# Patient Record
Sex: Female | Born: 1975 | Race: Black or African American | Hispanic: No | Marital: Married | Smoking: Never smoker
Health system: Southern US, Community
[De-identification: ages and names within clinical notes are randomized; demographics above are authoritative.]

## PROBLEM LIST (undated history)

## (undated) DIAGNOSIS — R87629 Unspecified abnormal cytological findings in specimens from vagina: Secondary | ICD-10-CM

## (undated) HISTORY — PX: NO PAST SURGERIES: SHX2092

---

## 2015-11-17 DIAGNOSIS — R87629 Unspecified abnormal cytological findings in specimens from vagina: Secondary | ICD-10-CM

## 2015-11-17 HISTORY — DX: Unspecified abnormal cytological findings in specimens from vagina: R87.629

## 2015-11-17 NOTE — L&D Delivery Note (Signed)
Delivery Note At 3:51 PM a viable female was delivered via  (Presentation: OA;  ).  APGAR:8,9 ; weight penidng .   Placenta status: intact, .  Cord: 3 vessel  with the following complications:none .  Cord pH: not done  Anesthesia:  none Episiotomy:  none Lacerations:  none Suture Repair: none Est. Blood Loss (mL):    Mom to postpartum.  Baby to Couplet care / Skin to Skin   Routine postpartum care.  Iverson Sees H 10/09/2016, 4:03 PM

## 2016-02-07 ENCOUNTER — Ambulatory Visit (INDEPENDENT_AMBULATORY_CARE_PROVIDER_SITE_OTHER): Payer: Self-pay | Admitting: Physician Assistant

## 2016-02-07 VITALS — BP 112/74 | HR 99 | Temp 99.3°F | Resp 17 | Ht 69.5 in | Wt 230.0 lb

## 2016-02-07 DIAGNOSIS — Z3490 Encounter for supervision of normal pregnancy, unspecified, unspecified trimester: Secondary | ICD-10-CM

## 2016-02-07 DIAGNOSIS — R110 Nausea: Secondary | ICD-10-CM

## 2016-02-07 DIAGNOSIS — R1030 Lower abdominal pain, unspecified: Secondary | ICD-10-CM

## 2016-02-07 DIAGNOSIS — N912 Amenorrhea, unspecified: Secondary | ICD-10-CM

## 2016-02-07 DIAGNOSIS — R6889 Other general symptoms and signs: Secondary | ICD-10-CM

## 2016-02-07 LAB — POCT URINALYSIS DIP (MANUAL ENTRY)
Bilirubin, UA: NEGATIVE
Glucose, UA: NEGATIVE
Ketones, POC UA: NEGATIVE
Leukocytes, UA: NEGATIVE
Nitrite, UA: NEGATIVE
Protein Ur, POC: NEGATIVE
Spec Grav, UA: 1.01
Urobilinogen, UA: 0.2
pH, UA: 6

## 2016-02-07 LAB — POCT URINE PREGNANCY: Preg Test, Ur: POSITIVE — AB

## 2016-02-07 MED ORDER — PRENATAL VITAMINS 0.8 MG PO TABS: 1.0000 | ORAL_TABLET | Freq: Every day | ORAL | Status: DC

## 2016-02-07 MED ORDER — PRENATAL VITAMINS 0.8 MG PO TABS: 1.0000 | ORAL_TABLET | Freq: Every day | ORAL | Status: AC

## 2016-02-07 NOTE — Patient Instructions (Addendum)
4 weeks - 5 days pregnant Due 10/10/2016  Pregnancy care help for the uninsured - will help her get medicaid to help with the cost  [phone] [facility] LipstickBlog.huWww.ncdhhs.gov/medicaid  [place] [facility]  [place] - [phone] [facility] [phone] [facility] - [phone] [facility] - [phone]    If you start bleeding or have a lot of cramping [facility] to [facility] to have further testing

## 2016-02-07 NOTE — Progress Notes (Signed)
Subjective:     Patient ID: Cindy [name], female   DOB: 12/31/1975, 40 y.o.   MRN: [id number]  HPI The history was taken by the patient's english speaking boyfriend.  The patient has been experiencing head, stomach and general body aches. He has shakes and chills. Symptoms started 3 weeks ago, and have been on and off ever since. Patient has nausea with no vomiting. States her body feels warm but no objective temperature has been taken. She denies stomach pain, no diarrhea, constipation or additional bowel changes.  Endorses lower back pain, patient thinks she is pregnant. Currently sexually active with boyfriend, does not use condoms or any protection during sex. She has had 5 pregnancies previously, children live in [place].  Review of Systems All pertinent ROS as above in HPI    Objective:   Physical Exam  Constitutional: She appears well-developed and well-nourished.  HENT:  Head: Normocephalic and atraumatic.  Right Ear: External ear normal.  Left Ear: External ear normal.  Nose: Nose normal.  Mouth/Throat: Oropharynx is clear and moist. No oropharyngeal exudate.  Eyes: EOM are normal. Pupils are equal, round, and reactive to light.  Neck: Neck supple.  Cardiovascular: Normal rate, regular rhythm, normal heart sounds and intact distal pulses.  Exam reveals no gallop and no friction rub.   No murmur heard. Pulmonary/Chest: Breath sounds normal. No respiratory distress. She has no wheezes. She has no rales.  Abdominal: Soft. Bowel sounds are normal. She exhibits no distension. There is no rebound and no guarding.  Mild suprapubic tenderness to palpation  Lymphadenopathy:    She has no cervical adenopathy.  Skin: Skin is warm and dry.    Results for orders placed or performed in visit on 02/07/16  POCT urine pregnancy  Result Value Ref Range   Preg Test, Ur Positive (A) Negative  POCT urinalysis dipstick  Result Value Ref Range   Color, UA yellow yellow   Clarity, UA clear  clear   Glucose, UA negative negative   Bilirubin, UA negative negative   Ketones, POC UA negative negative   Spec Grav, UA 1.010    Blood, UA trace-intact (A) negative   pH, UA 6.0    Protein Ur, POC negative negative   Urobilinogen, UA 0.2    Nitrite, UA Negative Negative   Leukocytes, UA Negative Negative      Assessment:     The patient is a 40 year old african female with a positive pregnancy test. Patient is 4wk 5d pregnant, no bleeding or urinary symptoms at this time. Will give patient prenatal vitamins and the information to establish medicaid insurance for prenatal care.     Plan:      2. Nausea without vomiting - POCT urine pregnancy  3. Lower abdominal pain - POCT urinalysis dipstick  4. Amenorrhea - Prenatal Multivit-Min-Fe-FA (PRENATAL VITAMINS) 0.8 MG tablet; Take 1 tablet by mouth daily.  Dispense: 90 tablet; Refill: 3

## 2016-02-07 NOTE — Progress Notes (Signed)
   Cindy [name]  MRN: [id number] DOB: 10/13/1976  Subjective:  Pt presents to clinic with head, stomach and general body aches. Has shakes and chills. Symptoms started 3 weeks ago, and have been on and off ever since. Patient has nausea with no vomiting. States her body feels warm but no objective temperature has been taken. She denies stomach pain, no diarrhea, constipation or additional bowel changes.  Endorses lower back pain, patient thinks she is pregnant. Currently sexually active with boyfriend, does not use condoms or any protection during sex. She has had 5 pregnancies previously, children live in [place].  Boyfriend interprets the visit as we were unable to get an interpretor on the phone that could speak her language.     There are no active problems to display for this patient.   No current outpatient prescriptions on file prior to visit.   No current facility-administered medications on file prior to visit.    No Known Allergies  Review of Systems  Constitutional: Negative for fever and chills.  Gastrointestinal: Positive for nausea.   Objective:  BP 112/74 mmHg  Pulse 99  Temp(Src) 99.3 F (37.4 C) (Oral)  Resp 17  Ht 5' 9.5" (1.765 m)  Wt 230 lb (104.327 kg)  BMI 33.49 kg/m2  SpO2 99%  LMP 01/04/2016  Physical Exam  Constitutional: She is oriented to person, place, and time and well-developed, well-nourished, and in no distress.  HENT:  Head: Normocephalic and atraumatic.  Right Ear: Hearing and external ear normal.  Left Ear: Hearing and external ear normal.  Eyes: Conjunctivae are normal.  Neck: Normal range of motion.  Cardiovascular: Normal rate, regular rhythm and normal heart sounds.   No murmur heard. Pulmonary/Chest: Effort normal and breath sounds normal. She has no wheezes.  Neurological: She is alert and oriented to person, place, and time. Gait normal.  Skin: Skin is warm and dry.  Psychiatric: Mood, memory, affect and judgment normal.    Vitals reviewed.   Results for orders placed or performed in visit on 02/07/16  POCT urine pregnancy  Result Value Ref Range   Preg Test, Ur Positive (A) Negative  POCT urinalysis dipstick  Result Value Ref Range   Color, UA yellow yellow   Clarity, UA clear clear   Glucose, UA negative negative   Bilirubin, UA negative negative   Ketones, POC UA negative negative   Spec Grav, UA 1.010    Blood, UA trace-intact (A) negative   pH, UA 6.0    Protein Ur, POC negative negative   Urobilinogen, UA 0.2    Nitrite, UA Negative Negative   Leukocytes, UA Negative Negative    Assessment and Plan :  Flu-like symptoms  Nausea without vomiting - Plan: POCT urine pregnancy  Lower abdominal pain - Plan: POCT urinalysis dipstick  Amenorrhea - Plan: Prenatal Multivit-Min-Fe-FA (PRENATAL VITAMINS) 0.8 MG tablet,   Pregnancy - PNV and gave her information for non-insured pregnant patients  Benny LennertSarah Weber PA-C  [facility] 02/07/2016 3:04 PM

## 2016-04-09 ENCOUNTER — Other Ambulatory Visit (HOSPITAL_COMMUNITY): Payer: Self-pay | Admitting: Nurse Practitioner

## 2016-04-09 DIAGNOSIS — Z3689 Encounter for other specified antenatal screening: Secondary | ICD-10-CM

## 2016-04-09 LAB — OB RESULTS CONSOLE GBS: STREP GROUP B AG: POSITIVE

## 2016-04-09 LAB — OB RESULTS CONSOLE GC/CHLAMYDIA
CHLAMYDIA, DNA PROBE: NEGATIVE
Gonorrhea: NEGATIVE

## 2016-04-09 LAB — OB RESULTS CONSOLE HIV ANTIBODY (ROUTINE TESTING)
HIV: NONREACTIVE
HIV: NONREACTIVE

## 2016-04-09 LAB — OB RESULTS CONSOLE RUBELLA ANTIBODY, IGM: Rubella: IMMUNE

## 2016-04-09 LAB — OB RESULTS CONSOLE RPR: RPR: NONREACTIVE

## 2016-04-09 LAB — OB RESULTS CONSOLE VARICELLA ZOSTER ANTIBODY, IGG: VARICELLA IGG: IMMUNE

## 2016-04-09 LAB — OB RESULTS CONSOLE HEPATITIS B SURFACE ANTIGEN: Hepatitis B Surface Ag: POSITIVE

## 2016-04-28 ENCOUNTER — Encounter (HOSPITAL_COMMUNITY): Payer: Self-pay | Admitting: Nurse Practitioner

## 2016-05-05 ENCOUNTER — Encounter (HOSPITAL_COMMUNITY): Payer: Self-pay

## 2016-05-06 ENCOUNTER — Encounter (HOSPITAL_COMMUNITY): Payer: Self-pay

## 2016-05-06 ENCOUNTER — Ambulatory Visit (HOSPITAL_COMMUNITY)
Admission: RE | Admit: 2016-05-06 | Discharge: 2016-05-06 | Disposition: A | Discharge status: Home / Self Care | Payer: Medicaid Other | Source: Ambulatory Visit | Attending: Nurse Practitioner | Admitting: Nurse Practitioner

## 2016-05-06 ENCOUNTER — Other Ambulatory Visit (HOSPITAL_COMMUNITY): Payer: Self-pay | Admitting: Maternal and Fetal Medicine

## 2016-05-06 DIAGNOSIS — O09522 Supervision of elderly multigravida, second trimester: Secondary | ICD-10-CM

## 2016-05-06 DIAGNOSIS — Z0489 Encounter for examination and observation for other specified reasons: Secondary | ICD-10-CM

## 2016-05-06 DIAGNOSIS — Z3A23 23 weeks gestation of pregnancy: Secondary | ICD-10-CM

## 2016-05-06 DIAGNOSIS — O09529 Supervision of elderly multigravida, unspecified trimester: Secondary | ICD-10-CM | POA: Insufficient documentation

## 2016-05-06 DIAGNOSIS — Z36 Encounter for antenatal screening of mother: Secondary | ICD-10-CM | POA: Insufficient documentation

## 2016-05-06 DIAGNOSIS — IMO0002 Reserved for concepts with insufficient information to code with codable children: Secondary | ICD-10-CM

## 2016-05-06 DIAGNOSIS — Z3A19 19 weeks gestation of pregnancy: Secondary | ICD-10-CM

## 2016-05-06 DIAGNOSIS — Z3689 Encounter for other specified antenatal screening: Secondary | ICD-10-CM

## 2016-05-06 NOTE — Progress Notes (Signed)
Appointment Date: 05/06/2016 DOB: 03/10/1976 Referring Provider: Trina AoBaker, Sandra K, NP Attending: Dr. Alpha GulaPaul Whitecar  Ms. Eyvonne LeftSalma Mccormick and her boyfriend, Fulton MoleHassane Moussa, were seen for genetic counseling because of a maternal age of 40 y.o..   Patient declined a medical interpreter, preferring to have her boyfriend translate.    In summary:  Discussed maternal age and associated risks for fetal aneuploidy  Reviewed normal results of Quad screen  Reduction in risk for Down syndrome and Trisomy 18  Not screening for other fetal aneuploidies  Reviewed screening available by ultrasound  Offered additional testing and screening  Declined NIPS  Declined amniocentesis  Reviewed family history concerns - none reported  Discussed carrier screening options - declined  CF  SMA  They were counseled regarding maternal age and the association with risk for chromosome conditions due to nondisjunction with aging of the ova.   We reviewed chromosomes, nondisjunction, and the associated 1 in 7770 risk for fetal aneuploidy related to a maternal age of 40 y.o. at 3948w0d gestation.  They were counseled that the risk for aneuploidy decreases as gestational age increases, accounting for those pregnancies which spontaneously abort.  We specifically discussed Down syndrome (trisomy 4421), trisomies 4013 and 6918, and sex chromosome aneuploidies (47,XXX and 47,XXY) including the common features and prognoses of each.   We discussed that the Quad screen is able to adjust a person's baseline (age related) chance for specific chromosome conditions.  Based on this test, her chance for Down syndrome was reduced from her age related chance of 1 in 95175 to 1 in 2552 ; the Quad screen also reduced the chance for Trisomy 2918 from her age related chance to 1 in 1,175.   She understands that Quad screen can not adjust her age related chance for sex chromosome aneuploidies or Trisomy 5613.  She was counseled that 50-80% of fetuses  with Down syndrome and up to 90% of fetuses with trisomies 13 and 18, when well visualized, have detectable anomalies or soft markers by ultrasound.  She was scheduled for ultrasound after her genetic counseling visit.     We also discussed the option of noninvasive prenatal screening (NIPS)/cell free DNA (cfDNA) screening.  They were counseled that this screening test can provide a pregnancy specific risk assessment. We reviewed the benefits and limitations of this screening option. Specifically, we discussed the conditions for which the test screens, the detection rates, and false positive rates for each. She was also counseled regarding diagnostic testing via amniocentesis. We reviewed the approximate 1 in 300-500 risk for complications for amniocentesis, including spontaneous pregnancy loss.   A complete ultrasound was performed today following our visit. The ultrasound report will be sent under separate cover.  Additional screening and diagnostic testing were declined today.  She understands that screening tests, including ultrasound and Quad screen, cannot rule out all birth defects or genetic syndromes. The patient was advised of this limitation and states she still does not want additional testing or screening at this time.   Mrs. Myna Hidalgodrissa was provided with written information regarding cystic fibrosis (CF), spinal muscular atrophy (SMA) and hemoglobinopathies including the carrier frequency, availability of carrier screening and prenatal diagnosis if indicated.  In addition, we discussed that CF and hemoglobinopathies are routinely screened for as part of the [facility] newborn screening panel.  After further discussion, she declined screening for CF and SMA .  A review of her medical record revealed that screening for hemoglobinopathies has been performed, but the results were not available.  Both family histories were reviewed and found to be noncontributory for birth defects, intellectual disability, and  known genetic conditions. Without further information regarding the provided family history, an accurate genetic risk cannot be calculated. Further genetic counseling is warranted if more information is obtained.  Mrs. Compere denied exposure to environmental toxins or chemical agents. She denied the use of alcohol, tobacco or street drugs. She denied significant viral illnesses during the course of her pregnancy. Her medical and surgical histories were noncontributory.   I counseled this couple regarding the above risks and available options.  The approximate face-to-face time with the genetic counselor was 45 minutes.  Mady Gemma, MS,  Certified Genetic Counselor

## 2016-05-15 ENCOUNTER — Encounter (HOSPITAL_COMMUNITY): Payer: Self-pay

## 2016-05-15 ENCOUNTER — Ambulatory Visit (HOSPITAL_COMMUNITY): Payer: Self-pay

## 2016-05-20 ENCOUNTER — Other Ambulatory Visit (HOSPITAL_COMMUNITY): Payer: Self-pay

## 2016-06-03 ENCOUNTER — Ambulatory Visit (HOSPITAL_COMMUNITY)
Admission: RE | Admit: 2016-06-03 | Discharge: 2016-06-03 | Disposition: A | Discharge status: Home / Self Care | Payer: Medicaid Other | Source: Ambulatory Visit | Attending: Nurse Practitioner | Admitting: Nurse Practitioner

## 2016-06-03 ENCOUNTER — Encounter (HOSPITAL_COMMUNITY): Payer: Self-pay

## 2016-06-03 DIAGNOSIS — O09522 Supervision of elderly multigravida, second trimester: Secondary | ICD-10-CM | POA: Insufficient documentation

## 2016-06-03 DIAGNOSIS — IMO0002 Reserved for concepts with insufficient information to code with codable children: Secondary | ICD-10-CM

## 2016-06-03 DIAGNOSIS — Z3A23 23 weeks gestation of pregnancy: Secondary | ICD-10-CM | POA: Insufficient documentation

## 2016-06-03 DIAGNOSIS — Z36 Encounter for antenatal screening of mother: Secondary | ICD-10-CM | POA: Insufficient documentation

## 2016-06-03 DIAGNOSIS — Z0489 Encounter for examination and observation for other specified reasons: Secondary | ICD-10-CM

## 2016-06-04 ENCOUNTER — Other Ambulatory Visit (HOSPITAL_COMMUNITY): Payer: Self-pay | Admitting: *Deleted

## 2016-06-04 DIAGNOSIS — O09529 Supervision of elderly multigravida, unspecified trimester: Secondary | ICD-10-CM

## 2016-06-05 ENCOUNTER — Encounter: Payer: Self-pay | Admitting: Obstetrics & Gynecology

## 2016-06-05 ENCOUNTER — Ambulatory Visit (INDEPENDENT_AMBULATORY_CARE_PROVIDER_SITE_OTHER): Payer: Medicaid Other | Admitting: Obstetrics & Gynecology

## 2016-06-05 VITALS — BP 107/65 | HR 74 | Wt 220.9 lb

## 2016-06-05 DIAGNOSIS — R87612 Low grade squamous intraepithelial lesion on cytologic smear of cervix (LGSIL): Secondary | ICD-10-CM | POA: Insufficient documentation

## 2016-06-05 LAB — POCT URINALYSIS DIP (DEVICE)
BILIRUBIN URINE: NEGATIVE
GLUCOSE, UA: NEGATIVE mg/dL
KETONES UR: NEGATIVE mg/dL
Leukocytes, UA: NEGATIVE
Nitrite: NEGATIVE
Protein, ur: NEGATIVE mg/dL
SPECIFIC GRAVITY, URINE: 1.02 (ref 1.005–1.030)
Urobilinogen, UA: 0.2 mg/dL (ref 0.0–1.0)
pH: 6 (ref 5.0–8.0)

## 2016-06-05 NOTE — Patient Instructions (Signed)

## 2016-06-05 NOTE — Progress Notes (Signed)
Patient ID: Cindy [name], female   DOB: 07/08/1976, 40 y.o.   MRN: [id number]  Chief Complaint  Patient presents with  . Colposcopy  [facility], [redacted] week EGA  HPI Cindy [name] is a 40 y.o. female.  [id number] 7849w2d   HPI  Indications: Pap smear on May 2017 showed: low-grade squamous intraepithelial neoplasia (LGSIL - encompassing HPV,mild dysplasia,CIN I). Previous colposcopy: no and in . Prior cervical treatment: no treatment.  No past medical history on file.  No past surgical history on file.  No family history on file.  Social History Social History  Substance Use Topics  . Smoking status: Never Smoker   . Smokeless tobacco: None  . Alcohol Use: None    No Known Allergies  Current Outpatient Prescriptions  Medication Sig Dispense Refill  . Prenatal Multivit-Min-Fe-FA (PRENATAL VITAMINS) 0.8 MG tablet Take 1 tablet by mouth daily. 90 tablet 3   No current facility-administered medications for this visit.    Review of Systems Review of Systems  Genitourinary: Negative for vaginal bleeding and vaginal discharge.    Blood pressure 107/65, pulse 74, weight 220 lb 14.4 oz (100.2 kg), last menstrual period 01/08/2016.  Physical Exam Physical Exam  Constitutional: She appears well-developed. No distress.  Pulmonary/Chest: Effort normal.  Psychiatric: She has a normal mood and affect. Her behavior is normal.    Data Reviewed Pap result and US  Assessment LSIL pap at [redacted] weeks gestation        Plan    Defer colposcopy and Bx until 6 week PP according to acceptable guideline Continue PNC at HD       Arvie Villarruel 06/05/2016, 9:54 AM

## 2016-07-10 ENCOUNTER — Encounter: Payer: Self-pay | Admitting: *Deleted

## 2016-07-17 ENCOUNTER — Other Ambulatory Visit (HOSPITAL_COMMUNITY): Payer: Self-pay | Admitting: Obstetrics and Gynecology

## 2016-07-17 ENCOUNTER — Ambulatory Visit (HOSPITAL_COMMUNITY)
Admission: RE | Admit: 2016-07-17 | Discharge: 2016-07-17 | Disposition: A | Discharge status: Home / Self Care | Payer: Medicaid Other | Source: Ambulatory Visit | Attending: Nurse Practitioner | Admitting: Nurse Practitioner

## 2016-07-17 ENCOUNTER — Encounter (HOSPITAL_COMMUNITY): Payer: Self-pay

## 2016-07-17 DIAGNOSIS — Z3A29 29 weeks gestation of pregnancy: Secondary | ICD-10-CM

## 2016-07-17 DIAGNOSIS — O09523 Supervision of elderly multigravida, third trimester: Secondary | ICD-10-CM

## 2016-07-17 DIAGNOSIS — Z36 Encounter for antenatal screening of mother: Secondary | ICD-10-CM | POA: Diagnosis not present

## 2016-07-17 DIAGNOSIS — O09529 Supervision of elderly multigravida, unspecified trimester: Secondary | ICD-10-CM

## 2016-07-21 ENCOUNTER — Other Ambulatory Visit (HOSPITAL_COMMUNITY): Payer: Self-pay | Admitting: *Deleted

## 2016-07-21 DIAGNOSIS — O09529 Supervision of elderly multigravida, unspecified trimester: Secondary | ICD-10-CM

## 2016-09-07 ENCOUNTER — Other Ambulatory Visit (HOSPITAL_COMMUNITY): Payer: Self-pay | Admitting: Obstetrics and Gynecology

## 2016-09-07 ENCOUNTER — Ambulatory Visit (HOSPITAL_COMMUNITY)
Admission: RE | Admit: 2016-09-07 | Discharge: 2016-09-07 | Disposition: A | Discharge status: Home / Self Care | Payer: Medicaid Other | Source: Ambulatory Visit | Attending: Nurse Practitioner | Admitting: Nurse Practitioner

## 2016-09-07 ENCOUNTER — Encounter (HOSPITAL_COMMUNITY): Payer: Self-pay

## 2016-09-07 DIAGNOSIS — Z3689 Encounter for other specified antenatal screening: Secondary | ICD-10-CM

## 2016-09-07 DIAGNOSIS — O99213 Obesity complicating pregnancy, third trimester: Secondary | ICD-10-CM | POA: Diagnosis present

## 2016-09-07 DIAGNOSIS — Z3A36 36 weeks gestation of pregnancy: Secondary | ICD-10-CM | POA: Insufficient documentation

## 2016-09-07 DIAGNOSIS — O09523 Supervision of elderly multigravida, third trimester: Secondary | ICD-10-CM | POA: Insufficient documentation

## 2016-09-07 DIAGNOSIS — O09529 Supervision of elderly multigravida, unspecified trimester: Secondary | ICD-10-CM

## 2016-09-07 NOTE — Addendum Note (Signed)
Encounter addended by: Jarmarcus Wambold M Rhema Boyett, RT on: 09/07/2016 12:26 PM<BR>    Actions taken: Imaging Exam ended

## 2016-10-01 ENCOUNTER — Telehealth (HOSPITAL_COMMUNITY): Payer: Self-pay | Admitting: *Deleted

## 2016-10-01 NOTE — Telephone Encounter (Signed)
Preadmission screen  

## 2016-10-09 ENCOUNTER — Encounter (HOSPITAL_COMMUNITY): Payer: Self-pay

## 2016-10-09 ENCOUNTER — Inpatient Hospital Stay (HOSPITAL_COMMUNITY)
Admission: RE | Admit: 2016-10-09 | Discharge: 2016-10-11 | DRG: 775 | Disposition: A | Discharge status: Home / Self Care | Payer: Medicaid Other | Source: Ambulatory Visit | Attending: Obstetrics & Gynecology | Admitting: Obstetrics & Gynecology

## 2016-10-09 DIAGNOSIS — Z3A41 41 weeks gestation of pregnancy: Secondary | ICD-10-CM

## 2016-10-09 DIAGNOSIS — O480 Post-term pregnancy: Principal | ICD-10-CM | POA: Diagnosis present

## 2016-10-09 LAB — RPR: RPR Ser Ql: NONREACTIVE

## 2016-10-09 LAB — CBC
HEMATOCRIT: 37.8 % (ref 36.0–46.0)
HEMOGLOBIN: 12.9 g/dL (ref 12.0–15.0)
MCH: 28.5 pg (ref 26.0–34.0)
MCHC: 34.1 g/dL (ref 30.0–36.0)
MCV: 83.6 fL (ref 78.0–100.0)
Platelets: 208 10*3/uL (ref 150–400)
RBC: 4.52 MIL/uL (ref 3.87–5.11)
RDW: 13.6 % (ref 11.5–15.5)
WBC: 7.7 10*3/uL (ref 4.0–10.5)

## 2016-10-09 LAB — ABO/RH: ABO/RH(D): O POS

## 2016-10-09 LAB — TYPE AND SCREEN
ABO/RH(D): O POS
Antibody Screen: NEGATIVE

## 2016-10-09 MED ORDER — OXYTOCIN BOLUS FROM INFUSION
500.0000 mL | Freq: Once | INTRAVENOUS | Status: AC
  Administered 2016-10-09: 500 mL via INTRAVENOUS

## 2016-10-09 MED ORDER — BENZOCAINE-MENTHOL 20-0.5 % EX AERO: 1.0000 "application " | INHALATION_SPRAY | CUTANEOUS | Status: DC | PRN

## 2016-10-09 MED ORDER — OXYTOCIN 40 UNITS IN LACTATED RINGERS INFUSION - SIMPLE MED
2.5000 [IU]/h | INTRAVENOUS | Status: DC
  Filled 2016-10-09: qty 1000

## 2016-10-09 MED ORDER — ZOLPIDEM TARTRATE 5 MG PO TABS: 5.0000 mg | ORAL_TABLET | Freq: Every evening | ORAL | Status: DC | PRN

## 2016-10-09 MED ORDER — LIDOCAINE HCL (PF) 1 % IJ SOLN
30.0000 mL | INTRAMUSCULAR | Status: DC | PRN
  Filled 2016-10-09: qty 30

## 2016-10-09 MED ORDER — SIMETHICONE 80 MG PO CHEW: 80.0000 mg | CHEWABLE_TABLET | ORAL | Status: DC | PRN

## 2016-10-09 MED ORDER — TERBUTALINE SULFATE 1 MG/ML IJ SOLN
0.2500 mg | Freq: Once | INTRAMUSCULAR | Status: DC | PRN
  Filled 2016-10-09: qty 1

## 2016-10-09 MED ORDER — ONDANSETRON HCL 4 MG/2ML IJ SOLN: 4.0000 mg | INTRAMUSCULAR | Status: DC | PRN

## 2016-10-09 MED ORDER — DIBUCAINE 1 % RE OINT: 1.0000 "application " | TOPICAL_OINTMENT | RECTAL | Status: DC | PRN

## 2016-10-09 MED ORDER — MISOPROSTOL 25 MCG QUARTER TABLET
25.0000 ug | ORAL_TABLET | ORAL | Status: DC | PRN
  Administered 2016-10-09: 25 ug via VAGINAL
  Filled 2016-10-09: qty 0.25
  Filled 2016-10-09: qty 1

## 2016-10-09 MED ORDER — COCONUT OIL OIL: 1.0000 "application " | TOPICAL_OIL | Status: DC | PRN

## 2016-10-09 MED ORDER — ACETAMINOPHEN 325 MG PO TABS: 650.0000 mg | ORAL_TABLET | ORAL | Status: DC | PRN

## 2016-10-09 MED ORDER — IBUPROFEN 600 MG PO TABS
600.0000 mg | ORAL_TABLET | Freq: Four times a day (QID) | ORAL | Status: DC
  Administered 2016-10-09 – 2016-10-11 (×8): 600 mg via ORAL
  Filled 2016-10-09 (×8): qty 1

## 2016-10-09 MED ORDER — ACETAMINOPHEN 325 MG PO TABS
650.0000 mg | ORAL_TABLET | ORAL | Status: DC | PRN
Start: 2016-10-09 — End: 2016-10-11
  Administered 2016-10-09 – 2016-10-11 (×3): 650 mg via ORAL
  Filled 2016-10-09 (×3): qty 2

## 2016-10-09 MED ORDER — FENTANYL CITRATE (PF) 100 MCG/2ML IJ SOLN: 100.0000 ug | INTRAMUSCULAR | Status: DC | PRN

## 2016-10-09 MED ORDER — LACTATED RINGERS IV SOLN: 500.0000 mL | INTRAVENOUS | Status: DC | PRN

## 2016-10-09 MED ORDER — TETANUS-DIPHTH-ACELL PERTUSSIS 5-2.5-18.5 LF-MCG/0.5 IM SUSP: 0.5000 mL | Freq: Once | INTRAMUSCULAR | Status: DC

## 2016-10-09 MED ORDER — PENICILLIN G POTASSIUM 5000000 UNITS IJ SOLR
5.0000 10*6.[IU] | Freq: Once | INTRAVENOUS | Status: AC
  Administered 2016-10-09: 5 10*6.[IU] via INTRAVENOUS
  Filled 2016-10-09: qty 5

## 2016-10-09 MED ORDER — METHYLERGONOVINE MALEATE 0.2 MG PO TABS: 0.2000 mg | ORAL_TABLET | ORAL | Status: DC | PRN

## 2016-10-09 MED ORDER — METHYLERGONOVINE MALEATE 0.2 MG/ML IJ SOLN: 0.2000 mg | INTRAMUSCULAR | Status: DC | PRN

## 2016-10-09 MED ORDER — OXYCODONE-ACETAMINOPHEN 5-325 MG PO TABS: 1.0000 | ORAL_TABLET | ORAL | Status: DC | PRN

## 2016-10-09 MED ORDER — DIPHENHYDRAMINE HCL 25 MG PO CAPS: 25.0000 mg | ORAL_CAPSULE | Freq: Four times a day (QID) | ORAL | Status: DC | PRN

## 2016-10-09 MED ORDER — ONDANSETRON HCL 4 MG/2ML IJ SOLN: 4.0000 mg | Freq: Four times a day (QID) | INTRAMUSCULAR | Status: DC | PRN

## 2016-10-09 MED ORDER — ONDANSETRON HCL 4 MG PO TABS: 4.0000 mg | ORAL_TABLET | ORAL | Status: DC | PRN

## 2016-10-09 MED ORDER — LACTATED RINGERS IV SOLN
INTRAVENOUS | Status: DC
  Administered 2016-10-09: 08:00:00 via INTRAVENOUS

## 2016-10-09 MED ORDER — PRENATAL MULTIVITAMIN CH
1.0000 | ORAL_TABLET | Freq: Every day | ORAL | Status: DC
  Administered 2016-10-10 – 2016-10-11 (×2): 1 via ORAL
  Filled 2016-10-09 (×2): qty 1

## 2016-10-09 MED ORDER — PENICILLIN G POT IN DEXTROSE 60000 UNIT/ML IV SOLN
3.0000 10*6.[IU] | INTRAVENOUS | Status: DC
  Administered 2016-10-09: 3 10*6.[IU] via INTRAVENOUS
  Filled 2016-10-09 (×4): qty 50

## 2016-10-09 MED ORDER — WITCH HAZEL-GLYCERIN EX PADS: 1.0000 "application " | MEDICATED_PAD | CUTANEOUS | Status: DC | PRN

## 2016-10-09 MED ORDER — SOD CITRATE-CITRIC ACID 500-334 MG/5ML PO SOLN: 30.0000 mL | ORAL | Status: DC | PRN

## 2016-10-09 MED ORDER — FLEET ENEMA 7-19 GM/118ML RE ENEM: 1.0000 | ENEMA | RECTAL | Status: DC | PRN

## 2016-10-09 MED ORDER — SENNOSIDES-DOCUSATE SODIUM 8.6-50 MG PO TABS
2.0000 | ORAL_TABLET | ORAL | Status: DC
  Administered 2016-10-09: 2 via ORAL
  Filled 2016-10-09: qty 2

## 2016-10-09 MED ORDER — OXYCODONE-ACETAMINOPHEN 5-325 MG PO TABS: 2.0000 | ORAL_TABLET | ORAL | Status: DC | PRN

## 2016-10-09 NOTE — H&P (Signed)
LABOR AND DELIVERY ADMISSION HISTORY AND PHYSICAL NOTE  Cindy [name] is a 40 y.o. female [phone] with IUP at 8216w2d by 19wk US presenting for IOL for post date.   She reports positive fetal movement. She denies leakage of fluid or vaginal bleeding.  Prenatal History/Complications:  Past Medical History: Past Medical History:  Diagnosis Date  . Medical history non-contributory     Past Surgical History: Past Surgical History:  Procedure Laterality Date  . NO PAST SURGERIES      Obstetrical History: OB History    Gravida Para Term Preterm AB Living   4 3 3     3    SAB TAB Ectopic Multiple Live Births                  Social History: Social History   Social History  . Marital status: Married    Spouse name: N/A  . Number of children: N/A  . Years of education: N/A   Social History Main Topics  . Smoking status: Never Smoker  . Smokeless tobacco: Never Used  . Alcohol use No  . Drug use: No  . Sexual activity: Not on file   Other Topics Concern  . Not on file   Social History Narrative   The patient lives with boyfriend, she came [place] ago to live with him. He has been in [place] since 2004.   The patient [place] 5 children in [place]    Family History: No family history on file.  Allergies: No Known Allergies  Prescriptions Prior to Admission  Medication Sig Dispense Refill Last Dose  . Prenatal Multivit-Min-Fe-FA (PRENATAL VITAMINS) 0.8 MG tablet Take 1 tablet by mouth daily. 90 tablet 3 10/08/2016     Review of Systems   All systems reviewed and negative except as stated in HPI  Blood pressure (!) 111/58, pulse 77, temperature 98.2 F (36.8 C), temperature source Oral, resp. rate 16, last menstrual period 01/08/2016. General appearance: alert, cooperative and appears stated age Lungs: clear to auscultation bilaterally Heart: regular rate and rhythm Abdomen: soft, non-tender; bowel sounds normal Extremities: No calf  swelling or tenderness Presentation: cephalic Fetal monitoring: Cat I tracing Uterine activity: no CTXs     Prenatal labs: ABO, Rh: --/--/O POS (11/24 0750) Antibody: NEG (11/24 0750) Rubella: !Error! RPR:    HBsAg:    HIV:    GBS:    1 hr Glucola: n/a Genetic screening: declined Anatomy US: wnl  Prenatal Transfer Tool  Maternal Diabetes: No Genetic Screening: Declined Maternal Ultrasounds/Referrals: Normal Fetal Ultrasounds or other Referrals:  None Maternal Substance Abuse:  No Significant Maternal Medications:  None Significant Maternal Lab Results: None  Results for orders placed or performed during the hospital encounter of 10/09/16 (from the past 24 hour(s))  CBC   Collection Time: 10/09/16  7:50 AM  Result Value Ref Range   WBC 7.7 4.0 - 10.5 K/uL   RBC 4.52 3.87 - 5.11 MIL/uL   Hemoglobin 12.9 12.0 - 15.0 g/dL   HCT 62.137.8 30.836.0 - 65.746.0 %   MCV 83.6 78.0 - 100.0 fL   MCH 28.5 26.0 - 34.0 pg   MCHC 34.1 30.0 - 36.0 g/dL   RDW 84.613.6 96.211.5 - 95.215.5 %   Platelets 208 150 - 400 K/uL  Type and screen [facility]   Collection Time: 10/09/16  7:50 AM  Result Value Ref Range   ABO/RH(D) O POS  Antibody Screen NEG    Sample Expiration 10/12/2016     Patient Active Problem List   Diagnosis Date Noted  . Post-dates pregnancy 10/09/2016  . Pap smear abnormality of cervix with LGSIL 06/05/2016  . [redacted] weeks gestation of pregnancy   . Advanced maternal age in multigravida     Assessment: Cindy [name] is a 40 y.o. [phone] at 279w2d here for IOL for postdates pregnancy  #Labor:Anticpate SVD. Plan IOL with cytotec and augment with pitocin four hrs after last cytotec dose. #Pain: IV pain meds prn/Epidural on request #FWB: Cat I tracing #ID:  GBS [id number] #MOF: Bottle #MOC:Declined birth control #Circ:  declined  WALLACE, NOAH I, DO PGY-3 10/09/2016, 10:47 AM  OB FELLOW HISTORY AND PHYSICAL ATTESTATION  I have seen and examined this patient;  I agree with above documentation in the resident's note.    Cindy [name] 10/09/2016, 12:05 PM

## 2016-10-09 NOTE — Progress Notes (Signed)
Pt speaks Jarma.  Unable to obtain a translator through hospital resources.  FOB speaks very little [place]. Anesthesia at bedside for consult.  Pt states she did not have any pain medication/epidurals with previous pregnancies that she delivered in [place].  Does not desire any interventions this labor as well. Will continue to monitor pain assessment to the best of my ability.

## 2016-10-09 NOTE — Anesthesia Pain Management Evaluation Note (Signed)
  CRNA Pain Management Visit Note  Patient: Cindy [name], [age] y.o., female  "Hello I am a member of the anesthesia team at [facility]. We have an anesthesia team available at all times to provide care throughout the hospital, including epidural management and anesthesia for C-section. I don't know your plan for the delivery whether it a natural birth, water birth, IV sedation, nitrous supplementation, doula or epidural, but we want to meet your pain goals."   1.Was your pain managed to your expectations on prior hospitalizations?   Yes   2.What is your expectation for pain management during this hospitalization?     Labor support without medications  3.How can we help you reach that goal? Labor support without medications.  Per husband (who knows some English), patient has had labor without medicinal support outside of the [place] For prior deliveries.  Patient and husband informed of the options of pain control for labor.  Questions answered.    Record the patient's initial score and the patient's pain goal.   Pain: 0  Pain Goal: Unable to enumerate d/t language barrier, but patient comfortable at this time.  [facility] wants you to be able to say your pain was always managed very well.  Elaine Roanhorse L 10/09/2016

## 2016-10-10 LAB — CBC
HCT: 35.3 % — ABNORMAL LOW (ref 36.0–46.0)
Hemoglobin: 12.2 g/dL (ref 12.0–15.0)
MCH: 28.8 pg (ref 26.0–34.0)
MCHC: 34.6 g/dL (ref 30.0–36.0)
MCV: 83.5 fL (ref 78.0–100.0)
PLATELETS: 208 10*3/uL (ref 150–400)
RBC: 4.23 MIL/uL (ref 3.87–5.11)
RDW: 13.7 % (ref 11.5–15.5)
WBC: 12.1 10*3/uL — ABNORMAL HIGH (ref 4.0–10.5)
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
## 2016-10-10 NOTE — Progress Notes (Signed)
Post Partum Day 1 Subjective: no complaints, up ad lib, voiding and tolerating PO Used family member to interpret with consent of patient  Objective: Blood pressure 104/62, pulse 69, temperature 98.1 F (36.7 C), temperature source Oral, resp. rate 16, last menstrual period 01/08/2016, SpO2 100 %, unknown if currently breastfeeding.  Physical Exam:  General: alert, cooperative and no distress Lochia: appropriate Uterine Fundus: firm Incision: healing well DVT Evaluation: No evidence of DVT seen on physical exam.   Recent Labs  10/09/16 0750 10/10/16 0520  HGB 12.9 12.2  HCT 37.8 35.3*    Assessment/Plan: Plan for discharge tomorrow and Breastfeeding   LOS: 1 day   [facility],Clearnce Leja 10/10/2016, 8:02 PM
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
## 2016-10-11 MED ORDER — IBUPROFEN 600 MG PO TABS: 600.0000 mg | ORAL_TABLET | Freq: Four times a day (QID) | ORAL | 0 refills | Status: DC

## 2016-10-11 NOTE — Progress Notes (Signed)
Rn called [employer] for the an interpreter, but an interpreter for the language " J[place]" of [place] was not available.   The first night FOB stayed and signed the interpreter paper. Then during the day , Mom threw FOB out of the Room.   Then other relatives signed the interpreter paper and stayed during the day.  But they were not available to stay the night.   Mom is able to communicate regarding pain and feeds with instructions.

## 2016-10-11 NOTE — Discharge Summary (Signed)
        OB Discharge Summary  Patient Name: Cindy [name] DOB: 03/23/1976 MRN: [id number]  Date of admission: 10/09/2016 Delivering MD: Duane LopeEURE, LUTHER H   Date of discharge: 10/11/2016  Admitting diagnosis: INDUCTION Intrauterine pregnancy: 7761w2d     Secondary diagnosis:Active Problems:   Post-dates pregnancy  Additional problems:none     Discharge diagnosis: Term Pregnancy Delivered                                                                     Post partum procedures:ne  Augmentation: none  Complications: None  Hospital course:  Onset of Labor With Vaginal Delivery     40 y.o. yo [id number] at 4761w2d was admitted in Active Labor on 10/09/2016. Patient had an uncomplicated labor course as follows:  Membrane Rupture Time/Date: 2:14 PM ,10/09/2016   Intrapartum Procedures: Episiotomy: None [1]                                         Lacerations:  None [1]  Patient had a delivery of a Viable infant. 10/09/2016  Information for the patient's newborn:  Cindy [name], Cindy [name] [[id number]][[id number]]  Delivery Method: Vaginal, Spontaneous Delivery (Filed from Delivery Summary)    Pateint had an uncomplicated postpartum course.  She is ambulating, tolerating a regular diet, passing flatus, and urinating well. Patient is discharged home in stable condition on 10/11/16.    Physical exam Vitals:   10/09/16 2326 10/10/16 0800 10/10/16 1732 10/11/16 0532  BP: 104/68 121/75 104/62 113/81  Pulse: 67 78 69 (!) 57  Resp: 18 18 16 18   Temp: 98 F (36.7 C) 98 F (36.7 C) 98.1 F (36.7 C) 97.7 F (36.5 C)  TempSrc: Oral Oral Oral Oral  SpO2:   100%    General: alert, cooperative and no distress Lochia: appropriate Uterine Fundus: firm Incision: N/A DVT Evaluation: No evidence of DVT seen on physical exam. Labs: Lab Results  Component Value Date   WBC 12.1 (H) 10/10/2016   HGB 12.2 10/10/2016   HCT 35.3 (L) 10/10/2016   MCV 83.5 10/10/2016   PLT 208 10/10/2016   No flowsheet data  found.  Discharge instruction: per After Visit Summary and "Baby and Me Booklet".  After Visit Meds:    Medication List    TAKE these medications   ibuprofen 600 MG tablet Commonly known as:  ADVIL,MOTRIN Take 1 tablet (600 mg total) by mouth every 6 (six) hours.   Prenatal Vitamins 0.8 MG tablet Take 1 tablet by mouth daily.       Diet: routine diet  Activity: Advance as tolerated. Pelvic rest for 6 weeks.   Outpatient follow up:6 weeks Follow up Appt:No future appointments. Follow up visit: No Follow-up on file.  Postpartum contraception: Undecided  Newborn Data: Live born female  Birth Weight: 7 lb 5.3 oz (3325 g) APGAR: 8, 9  Baby Feeding: Breast Disposition:home with mother   10/11/2016 Cindy [name], CNM

## 2016-11-20 ENCOUNTER — Encounter (HOSPITAL_COMMUNITY): Payer: Self-pay | Admitting: *Deleted

## 2016-11-20 ENCOUNTER — Inpatient Hospital Stay (HOSPITAL_COMMUNITY)
Admission: AD | Admit: 2016-11-20 | Discharge: 2016-11-20 | Disposition: A | Discharge status: Home / Self Care | Payer: Medicaid Other | Source: Ambulatory Visit | Attending: Obstetrics & Gynecology | Admitting: Obstetrics & Gynecology

## 2016-11-20 DIAGNOSIS — R109 Unspecified abdominal pain: Secondary | ICD-10-CM | POA: Diagnosis present

## 2016-11-20 DIAGNOSIS — N898 Other specified noninflammatory disorders of vagina: Secondary | ICD-10-CM | POA: Insufficient documentation

## 2016-11-20 DIAGNOSIS — Z711 Person with feared health complaint in whom no diagnosis is made: Secondary | ICD-10-CM | POA: Diagnosis not present

## 2016-11-20 DIAGNOSIS — Z79899 Other long term (current) drug therapy: Secondary | ICD-10-CM | POA: Diagnosis not present

## 2016-11-20 HISTORY — DX: Unspecified abnormal cytological findings in specimens from vagina: R87.629

## 2016-11-20 LAB — URINALYSIS, ROUTINE W REFLEX MICROSCOPIC
BACTERIA UA: NONE SEEN
BILIRUBIN URINE: NEGATIVE
GLUCOSE, UA: NEGATIVE mg/dL
KETONES UR: NEGATIVE mg/dL
LEUKOCYTES UA: NEGATIVE
NITRITE: NEGATIVE
PH: 6 (ref 5.0–8.0)
Protein, ur: NEGATIVE mg/dL
RBC / HPF: NONE SEEN RBC/hpf (ref 0–5)
SPECIFIC GRAVITY, URINE: 1.005 (ref 1.005–1.030)
Squamous Epithelial / LPF: NONE SEEN
WBC, UA: NONE SEEN WBC/hpf (ref 0–5)

## 2016-11-20 LAB — WET PREP, GENITAL
Clue Cells Wet Prep HPF POC: NONE SEEN
SPERM: NONE SEEN
TRICH WET PREP: NONE SEEN
WBC, Wet Prep HPF POC: NONE SEEN
YEAST WET PREP: NONE SEEN

## 2016-11-20 LAB — POCT PREGNANCY, URINE: Preg Test, Ur: NEGATIVE

## 2016-11-20 NOTE — Discharge Instructions (Signed)
Colposcopy Colposcopy is a procedure to examine the lowest part of the uterus (cervix), the vagina, and the area around the vaginal opening (vulva) for abnormalities or signs of disease. The procedure is done using a lighted microscope or magnifying lens (colposcope). If any unusual cells are found during the procedure, your health care provider may remove a tissue sample for testing (biopsy). A colposcopy may be done if you:  Have an abnormal Pap test. A Pap test is a screening test that is used to check for signs of cancer or infection of the vagina, cervix, and uterus.  Have a Pap smear test in which you test positive for high-risk HPV (human papillomavirus).  Have a sore or lesion on your cervix.  Have genital warts on your vulva, vagina, or cervix.  Took certain medicines while pregnant, such as diethylstilbestrol (DES).  Have pain during sexual intercourse.  Have vaginal bleeding, especially after sexual intercourse.  Need to have a cervical polyp removed.  Need to have a lost intrauterine device (IUD) string located. Let your health care provider know about:  Any allergies you have, including allergies to prescribed medicine, latex, or iodine.  All medicines you are taking, including vitamins, herbs, eye drops, creams, and over-the-counter medicines. Bring a list of all of your medicines to your appointment.  Any problems you or family members have had with anesthetic medicines.  Any blood disorders you have.  Any surgeries you have had.  Any medical conditions you have, such as pelvic inflammatory disease (PID) or endometrial disorder.  Any history of frequent fainting.  Your menstrual cycle and what form of birth control (contraception) you use.  Your medical history, including any prior cervical treatment.  Whether you are pregnant or may be pregnant. What are the risks? Generally, this is a safe procedure. However, problems may occur,  including:  Pain.  Infection, which may include a fever, bad-smelling discharge, or pelvic pain.  Bleeding or discharge.  Misdiagnosis.  Fainting and vasovagal reactions, but this is rare.  Allergic reactions to medicines.  Damage to other structures or organs. What happens before the procedure?  If you have your menstrual period or will have it at the time of your procedure, tell your health care provider. A colposcopy typically is not done during menstruation.  Continue your contraceptive practices before and after the procedure.  For 24 hours before the colposcopy:  Do not douche.  Do not use tampons.  Do not use medicines, creams, or suppositories in the vagina.  Do not have sexual intercourse.  Ask your health care provider about:  Changing or stopping your regular medicines. This is especially important if you are taking diabetes medicines or blood thinners.  Taking medicines such as aspirin and ibuprofen. These medicines can thin your blood. Do not take these medicines before your procedure if your health care provider instructs you not to. It is likely that your health care provider will tell you to avoid taking aspirin or medicine that contains aspirin for 7 days before the procedure.  Follow instructions from your health care provider about eating or drinking restrictions. You will likely need to eat a regular diet the day of the procedure and not skip any meals.  You may have an exam or testing. A pregnancy test will be taken on the day of the procedure.  You may have a blood or urine sample taken.  Plan to have someone take you home from the hospital or clinic.  If you will be going   home right after the procedure, plan to have someone with you for 24 hours. What happens during the procedure?  You will lie down on your back, with your feet in foot rests (stirrups).  A warmed and lubricated instrument (speculum) will be inserted into your vagina. The  speculum will be used to hold apart the walls of your vagina so your health care provider can see your cervix and the inside of your vagina.  A cotton swab will be used to place a small amount of liquid solution on the areas to be examined. This solution makes it easier to see abnormal cells. You may feel a slight burning during this part.  The colposcope will be used to scan the cervix with a bright white light. The colposcope will be held near your vulvaand will magnify your vulva, vagina, and cervix for easier examination.  Your health care provider may decide to take a biopsy. If so:  You may be given medicine to numb the area (local anesthetic).  Surgical instruments will be used to suck out mucus and cells through your vagina.  You may feel mild pain while the tissue sample is removed.  Bleeding may occur. A solution may be used to stop the bleeding.  If a sample of tissue is needed from the inside of the cervix, a different procedure called endocervical curettage (ECC) may be completed. During this procedure, a curved instrument (curette) will be used to scrape cells from your cervix or the top of your cervix (endocervix).  Your health care provider will record the location of any abnormalities. The procedure may vary among health care providers and hospitals. What happens after the procedure?  You will lie down and rest for a few minutes. You may be offered juice or cookies.  Your blood pressure, heart rate, breathing rate, and blood oxygen level will be monitored until any medicines you were given have worn off.  You may have to wear compression stockings. These stockings help to prevent blood clots and reduce swelling in your legs.  You may have some cramping in your abdomen. This should go away after a few minutes. This information is not intended to replace advice given to you by your health care provider. Make sure you discuss any questions you have with your health care  provider. Document Released: 01/23/2003 Document Revised: 06/30/2016 Document Reviewed: 06/08/2016 Elsevier Interactive Patient Education  2017 Elsevier Inc.  

## 2016-11-20 NOTE — MAU Provider Note (Signed)
History     CSN: [id number]  Arrival date and time: 11/20/16 [id number]   First Provider Initiated Contact with Patient 11/20/16 0914      Chief Complaint  Patient presents with  . Abdominal Pain   HPI  Cindy [name] is a 41 y.o. [id number], postpartum female who presents today for evaluation of lower back and abdominal pain as well as a thick white vaginal discharge and some light spotting. Patient reports pain and discharge has been presents for the past week and she feels it is getting worse. Patient reports spotting was noticed this morning when she went to the bathroom. Patient reports she has not has sexual intercourse since delivery on 10/09/16. Patient denies any fever, chills, nausea, vomiting, dysuria or constipation. 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  Notably patient speaks Zharma, a translator was not available, the patient's friend was present and translated during the encounter. [facility] for postpartum visit.   OB History    Gravida Para Term Preterm AB Living   4 4 4     4    SAB TAB Ectopic Multiple Live Births         0 1      Past Medical History:  Diagnosis Date  . 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 NO PAST SURGERIES      History reviewed. No pertinent family history.  Social History  Substance Use Topics  . Smoking status: Never Smoker  . Smokeless tobacco: Never Used  . Alcohol use No    Allergies: No Known Allergies  Prescriptions Prior to Admission  Medication Sig Dispense Refill Last Dose  . ibuprofen (ADVIL,MOTRIN) 600 MG tablet Take 1 tablet (600 mg total) by mouth every 6 (six) hours. 30 tablet 0 Past Week at Unknown time  . Prenatal Multivit-Min-Fe-FA (PRENATAL VITAMINS) 0.8 MG tablet Take 1 tablet by mouth daily. 90 tablet 3 Past Week at  Unknown time    Review of Systems  Constitutional: Negative for chills and fever.  Gastrointestinal: Positive for abdominal pain. Negative for constipation, diarrhea, nausea and vomiting.  Genitourinary: Positive for vaginal bleeding and vaginal discharge. Negative for dysuria.   Physical Exam   Blood pressure 123/70, pulse 71, temperature 98 F (36.7 C), temperature source Oral, resp. rate 16, unknown if currently breastfeeding.  Physical Exam  Constitutional: She is oriented to person, place, and time. She appears well-developed and well-nourished.  Cardiovascular: Normal rate.   Respiratory: Effort normal.  GI: Soft. Bowel sounds are normal. There is tenderness in the suprapubic area.  Genitourinary: There is no lesion on the right labia. There is no lesion on the left labia. Cervix exhibits no motion tenderness. Right adnexum displays no tenderness. Left adnexum displays no tenderness.  Neurological: She is alert and oriented to person, place, and time.  Skin: Skin is warm and dry.   Results for orders placed or performed during the hospital encounter of 11/20/16 (from the past 24 hour(s))  Urinalysis, Routine w reflex microscopic     Status: Abnormal   Collection Time: 11/20/16  9:09 AM  Result Value Ref Range   Color, Urine STRAW (A) YELLOW   APPearance CLEAR CLEAR   Specific Gravity, Urine 1.005 1.005 - 1.030   pH 6.0 5.0 - 8.0   Glucose,  UA NEGATIVE NEGATIVE mg/dL   Hgb urine dipstick SMALL (A) NEGATIVE   Bilirubin Urine NEGATIVE NEGATIVE   Ketones, ur NEGATIVE NEGATIVE mg/dL   Protein, ur NEGATIVE NEGATIVE mg/dL   Nitrite NEGATIVE NEGATIVE   Leukocytes, UA NEGATIVE NEGATIVE   RBC / HPF NONE SEEN 0 - 5 RBC/hpf   WBC, UA NONE SEEN 0 - 5 WBC/hpf   Bacteria, UA NONE SEEN NONE SEEN   Squamous Epithelial / LPF NONE SEEN NONE SEEN  Pregnancy, urine POC     Status: None   Collection Time: 11/20/16  9:12 AM  Result Value Ref Range   Preg Test, Ur NEGATIVE NEGATIVE  Wet  prep, genital     Status: None   Collection Time: 11/20/16  9:32 AM  Result Value Ref Range   Yeast Wet Prep HPF POC NONE SEEN NONE SEEN   Trich, Wet Prep NONE SEEN NONE SEEN   Clue Cells Wet Prep HPF POC NONE SEEN NONE SEEN   WBC, Wet Prep HPF POC NONE SEEN NONE SEEN   Sperm NONE SEEN     MAU Course  Procedures  MDM  Urine pregnancy UA Wet Prep  Defer HPV testing for outpatient visit  Assessment and Plan   1. Physically well, but worried  Discharge home in stable condition Schedule follow up with [facility] clinic for postpartum colposcopy Reassured that symptoms are most likely due to body returning to normal menstrual cycle Return to MAU for emergencies  Dartha Lodge 11/20/2016, 9:33 AM

## 2016-11-20 NOTE — MAU Note (Addendum)
Pt C/O lower abd pain for the past week.  Pt had vaginal delivery on 11/24.  Has started spotting today, also having white thick discharge.  Pt is very concerned that she tested positive for HPV during her pregnancy.  Wants to be retested.  Also C/O back pain.

## 2016-11-20 NOTE — MAU Provider Note (Signed)
History     CSN: [id number]  Arrival date and time: 11/20/16 4098   First Provider Initiated Contact with Patient 11/20/16 0914      Chief Complaint  Patient presents with  . Abdominal Pain   HPI Cindy [name] is a 41 y.o. G66P4004 female who presents for concerns regarding previous pap smear. Had SVD in November & went to [facility] yesterday for her postpartum visit. During her prenatal care had abnormal pap smear (LGSIL with +HPV). Per review of notes patient needs PP colposcopy. Patient states she is here today to be retested for HPV; is concerned that this is a problem & wants treatment for it. Reports lower abdominal cramping, vaginal discharge, & vaginal spotting since earlier this week. Has not had intercourse since delivery. Denies n/v/d, dysuria, vaginal odor or irritation.   Patient speaks Zharma(?). Interpreter not available through [employer]. Family friend at bedside translating.   OB History    Gravida Para Term Preterm AB Living   4 4 4     4    SAB TAB Ectopic Multiple Live Births         0 1      Past Medical History:  Diagnosis Date  . 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 NO PAST SURGERIES      History reviewed. No pertinent family history.  Social History  Substance Use Topics  . Smoking status: Never Smoker  . Smokeless tobacco: Never Used  . Alcohol use No    Allergies: No Known Allergies  Prescriptions Prior to Admission  Medication Sig Dispense Refill Last Dose  . ibuprofen (ADVIL,MOTRIN) 600 MG tablet Take 1 tablet (600 mg total) by mouth every 6 (six) hours. 30 tablet 0 Past Week at Unknown time  . Prenatal Multivit-Min-Fe-FA (PRENATAL VITAMINS) 0.8 MG tablet Take 1 tablet by mouth daily. 90 tablet 3 Past Week at Unknown time    Review of Systems  Constitutional: Negative.   Gastrointestinal: Positive for abdominal pain. Negative for diarrhea, nausea and vomiting.   Genitourinary: Positive for vaginal bleeding and vaginal discharge.   Physical Exam   Blood pressure 123/70, pulse 71, temperature 98 F (36.7 C), temperature source Oral, resp. rate 16, unknown if currently breastfeeding.  Physical Exam  Nursing note and vitals reviewed. Constitutional: She is oriented to person, place, and time. She appears well-developed and well-nourished. No distress.  HENT:  Head: Normocephalic and atraumatic.  Eyes: Conjunctivae are normal. Right eye exhibits no discharge. Left eye exhibits no discharge. No scleral icterus.  Neck: Normal range of motion.  Cardiovascular: Normal rate, regular rhythm and normal heart sounds.   No murmur heard. Respiratory: Effort normal and breath sounds normal. No respiratory distress. She has no wheezes.  GI: Soft. Bowel sounds are normal. She exhibits no distension. There is no tenderness. There is no rebound.  Genitourinary: Uterus normal. Cervix exhibits no motion tenderness. No bleeding in the vagina.  Neurological: She is alert and oriented to person, place, and time.  Skin: Skin is warm and dry. She is not diaphoretic.  Psychiatric: She has a normal mood and affect. Her behavior is normal. Judgment and thought content normal.    MAU Course  Procedures Results for orders placed or performed during the hospital encounter of 11/20/16 (from the past 24 hour(s))  Urinalysis, Routine w reflex microscopic     Status: Abnormal   Collection Time: 11/20/16  9:09 AM  Result Value Ref Range   Color, Urine STRAW (A) YELLOW   APPearance CLEAR CLEAR   Specific Gravity, Urine 1.005 1.005 - 1.030   pH 6.0 5.0 - 8.0   Glucose, UA NEGATIVE NEGATIVE mg/dL   Hgb urine dipstick SMALL (A) NEGATIVE   Bilirubin Urine NEGATIVE NEGATIVE   Ketones, ur NEGATIVE NEGATIVE mg/dL   Protein, ur NEGATIVE NEGATIVE mg/dL   Nitrite NEGATIVE NEGATIVE   Leukocytes, UA NEGATIVE NEGATIVE   RBC / HPF NONE SEEN 0 - 5 RBC/hpf   WBC, UA NONE SEEN 0 - 5  WBC/hpf   Bacteria, UA NONE SEEN NONE SEEN   Squamous Epithelial / LPF NONE SEEN NONE SEEN  Pregnancy, urine POC     Status: None   Collection Time: 11/20/16  9:12 AM  Result Value Ref Range   Preg Test, Ur NEGATIVE NEGATIVE  Wet prep, genital     Status: None   Collection Time: 11/20/16  9:32 AM  Result Value Ref Range   Yeast Wet Prep HPF POC NONE SEEN NONE SEEN   Trich, Wet Prep NONE SEEN NONE SEEN   Clue Cells Wet Prep HPF POC NONE SEEN NONE SEEN   WBC, Wet Prep HPF POC NONE SEEN NONE SEEN   Sperm NONE SEEN    MDM UPT negative No blood on exam Wet prep -- negative U/a negative Discussed with patient that she needs a colposcopy as per previously recommended & that we don't do that through the MAU. Patient agreeable to plan & [facility]  Assessment and Plan  A: 1. Physically well but worried    P: Discharge home [facility]   Judeth Hornrin Lateasha Breuer 11/20/2016, 9:14 AM

## 2016-11-23 ENCOUNTER — Encounter: Payer: Medicaid Other | Admitting: Obstetrics & Gynecology

## 2016-11-24 ENCOUNTER — Ambulatory Visit (INDEPENDENT_AMBULATORY_CARE_PROVIDER_SITE_OTHER): Payer: Medicaid Other | Admitting: Medical

## 2016-11-24 ENCOUNTER — Other Ambulatory Visit (HOSPITAL_COMMUNITY)
Admission: RE | Admit: 2016-11-24 | Discharge: 2016-11-24 | Disposition: A | Discharge status: Home / Self Care | Payer: Medicaid Other | Source: Ambulatory Visit | Attending: Medical | Admitting: Medical

## 2016-11-24 ENCOUNTER — Encounter: Payer: Self-pay | Admitting: Medical

## 2016-11-24 VITALS — BP 128/77 | HR 72 | Wt 215.0 lb

## 2016-11-24 DIAGNOSIS — R87612 Low grade squamous intraepithelial lesion on cytologic smear of cervix (LGSIL): Secondary | ICD-10-CM | POA: Diagnosis present

## 2016-11-24 LAB — POCT PREGNANCY, URINE: PREG TEST UR: NEGATIVE

## 2016-11-24 NOTE — Progress Notes (Signed)
    GYNECOLOGY CLINIC COLPOSCOPY PROCEDURE NOTE  Ms. Eyvonne LeftSalma Reede is a 41 y.o. [id number] here for colposcopy for low-grade squamous intraepithelial neoplasia (LGSIL - encompassing HPV,mild dysplasia,CIN I) pap smear on 05/13/16. Discussed role for HPV in cervical dysplasia, need for surveillance.  Patient given informed consent, signed copy in the chart, time out was performed.  Placed in lithotomy position. Cervix viewed with speculum and colposcope after application of acetic acid.   Colposcopy adequate? Yes  acetowhite lesion(s) noted around most of the cervical os, most prominent from 8 o'clock to 12 o'clock and abnormal vessels noted at 11 o'clock; biopsies obtained.  ECC specimen obtained. All specimens were labelled and sent to pathology.   Patient was given post procedure instructions.  Will follow up pathology and manage accordingly.  Routine preventative health maintenance measures emphasized.   Marny LowensteinJulie N Karman Veney, PA-C 11/24/2016 10:26 AM

## 2016-11-24 NOTE — Patient Instructions (Signed)
Colposcopy, Care After This sheet gives you information about how to care for yourself after your procedure. Your doctor may also give you more specific instructions. If you have problems or questions, contact your doctor. What can I expect after the procedure? If you did not have a tissue sample removed (did not have a biopsy), you may only have some spotting for a few days. You can go back to your normal activities. If you had a tissue sample removed, it is common to have:  Soreness and pain. This may last for a few days.  Light-headedness.  Mild bleeding from your vagina or dark-colored, grainy discharge from your vagina. This may last for a few days. You may need to wear a sanitary pad.  Spotting for at least 48 hours after the procedure. Follow these instructions at home:  Take over-the-counter and prescription medicines only as told by your doctor. Ask your doctor what medicines you can start taking again. This is very important if you take blood-thinning medicine.  Do not drive or use heavy machinery while taking prescription pain medicine.  For 3 days, or as long as your doctor tells you, avoid:  Douching.  Using tampons.  Having sex.  If you use birth control (contraception), keep using it.  Limit activity for the first day after the procedure. Ask your doctor what activities are safe for you.  It is up to you to get the results of your procedure. Ask your doctor when your results will be ready.  Keep all follow-up visits as told by your doctor. This is important. Contact a doctor if:  You get a skin rash. Get help right away if:  You are bleeding a lot from your vagina. It is a lot of bleeding if you are using more than one pad an hour for 2 hours in a row.  You have clumps of blood (blood clots) coming from your vagina.  You have a fever.  You have chills  You have pain in your lower belly (pelvic area).  You have signs of infection, such as vaginal  discharge that is:  Different than usual.  Yellow.  Bad-smelling.  You have very pain or cramps in your lower belly that do not get better with medicine.  You feel light-headed.  You feel dizzy.  You pass out (faint). Summary  If you did not have a tissue sample removed (did not have a biopsy), you may only have some spotting for a few days. You can go back to your normal activities.  If you had a tissue sample removed, it is common to have mild pain and spotting for 48 hours.  For 3 days, or as long as your doctor tells you, avoid douching, using tampons and having sex.  Get help right away if you have bleeding, very bad pain, or signs of infection. This information is not intended to replace advice given to you by your health care provider. Make sure you discuss any questions you have with your health care provider. Document Released: 04/20/2008 Document Revised: 07/22/2016 Document Reviewed: 07/22/2016 Elsevier Interactive Patient Education  2017 Elsevier Inc.  

## 2016-11-30 ENCOUNTER — Telehealth: Payer: Self-pay | Admitting: General Practice

## 2016-11-30 NOTE — Telephone Encounter (Signed)
Per Raynelle FanningJulie, patient's colpo was low grade. No further treatment is needed at this time but patient does need to return in one year for pap smear with HPV testing. Called patient with [employer] interpreter [phone], patient speaks english and does not require an interpreter. Informed her of results & recommendations. Patient verbalized understanding & had no questions

## 2017-03-11 ENCOUNTER — Encounter (HOSPITAL_COMMUNITY): Payer: Self-pay | Admitting: *Deleted

## 2017-03-11 ENCOUNTER — Emergency Department (HOSPITAL_COMMUNITY)
Admission: EM | Admit: 2017-03-11 | Discharge: 2017-03-11 | Disposition: A | Discharge status: Home / Self Care | Payer: Medicaid Other | Attending: Emergency Medicine | Admitting: Emergency Medicine

## 2017-03-11 DIAGNOSIS — X118XXA Contact with other hot tap-water, initial encounter: Secondary | ICD-10-CM | POA: Insufficient documentation

## 2017-03-11 DIAGNOSIS — T300 Burn of unspecified body region, unspecified degree: Secondary | ICD-10-CM

## 2017-03-11 DIAGNOSIS — T22032A Burn of unspecified degree of left upper arm, initial encounter: Secondary | ICD-10-CM | POA: Insufficient documentation

## 2017-03-11 DIAGNOSIS — Y9389 Activity, other specified: Secondary | ICD-10-CM | POA: Insufficient documentation

## 2017-03-11 DIAGNOSIS — Z79899 Other long term (current) drug therapy: Secondary | ICD-10-CM | POA: Insufficient documentation

## 2017-03-11 DIAGNOSIS — Y929 Unspecified place or not applicable: Secondary | ICD-10-CM | POA: Insufficient documentation

## 2017-03-11 DIAGNOSIS — Y999 Unspecified external cause status: Secondary | ICD-10-CM | POA: Insufficient documentation

## 2017-03-11 MED ORDER — FENTANYL CITRATE (PF) 100 MCG/2ML IJ SOLN
100.0000 ug | Freq: Once | INTRAMUSCULAR | Status: AC
  Administered 2017-03-11: 100 ug via INTRAVENOUS
  Filled 2017-03-11: qty 2

## 2017-03-11 MED ORDER — ONDANSETRON HCL 4 MG/2ML IJ SOLN
4.0000 mg | Freq: Once | INTRAMUSCULAR | Status: AC
  Administered 2017-03-11: 4 mg via INTRAVENOUS
  Filled 2017-03-11: qty 2

## 2017-03-11 MED ORDER — FENTANYL CITRATE (PF) 100 MCG/2ML IJ SOLN: 50.0000 ug | INTRAMUSCULAR | Status: DC | PRN

## 2017-03-11 MED ORDER — HYDROCODONE-ACETAMINOPHEN 5-325 MG PO TABS: 2.0000 | ORAL_TABLET | ORAL | 0 refills | Status: AC | PRN

## 2017-03-11 MED ORDER — SILVER SULFADIAZINE 1 % EX CREA
TOPICAL_CREAM | Freq: Once | CUTANEOUS | Status: AC
  Administered 2017-03-11: 1 via TOPICAL
  Filled 2017-03-11: qty 85
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
## 2017-03-11 NOTE — Discharge Instructions (Signed)
Call the [facility] at [facility] today...  [phone] [address], [address]  [place], [place] [address]  to confirm appointment tomorrow at [facility]
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
## 2017-03-12 ENCOUNTER — Encounter: Payer: Medicaid Other | Attending: Physician Assistant | Admitting: Physician Assistant

## 2017-03-12 DIAGNOSIS — D649 Anemia, unspecified: Secondary | ICD-10-CM | POA: Insufficient documentation

## 2017-03-12 DIAGNOSIS — M79622 Pain in left upper arm: Secondary | ICD-10-CM | POA: Insufficient documentation

## 2017-03-12 DIAGNOSIS — T22332A Burn of third degree of left upper arm, initial encounter: Secondary | ICD-10-CM | POA: Insufficient documentation

## 2017-03-12 DIAGNOSIS — X58XXXA Exposure to other specified factors, initial encounter: Secondary | ICD-10-CM | POA: Insufficient documentation
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
## 2017-03-14 NOTE — Progress Notes (Signed)
Cindy [name], Cindy [name] ([id number]) Visit Report for 03/12/2017 Abuse/Suicide Risk Screen Details Patient Name: Cindy [name], Cindy [name] Date of Service: 03/12/2017 9:00 AM Medical Record Number: [id number] Patient Account Number: [id number] Date of Birth/Sex: 23-Jul-1976 (41 y.o. Female) Treating RN: Afful, RN, BSN, [employer] Primary Care Jameil Whitmoyer: PATIENT, NO Other Clinician: Referring Ramanda Paules: Rolland Porter Treating Jaselyn Nahm/Extender: STONE III, HOYT Weeks in Treatment: 0 Abuse/Suicide Risk Screen Items Answer ABUSE/SUICIDE RISK SCREEN: Has anyone close to you tried to hurt or harm you recentlyo No Do you feel uncomfortable with anyone in your familyo No Has anyone forced you do things that you didnot want to doo No Do you have any thoughts of harming yourselfo No Patient displays signs or symptoms of abuse and/or neglect. No Electronic Signature(s) Signed: 03/12/2017 4:36:00 PM By: Elpidio Eric BSN, RN Entered By: Elpidio Eric on 03/12/2017 09:14:27 Cindy [name] ([id number]) -------------------------------------------------------------------------------- Activities of Daily Living Details Patient Name: Cindy [name] Date of Service: 03/12/2017 9:00 AM Medical Record Number: [id number] Patient Account Number: [id number] Date of Birth/Sex: Oct 15, 1976 (41 y.o. Female) Treating RN: Afful, RN, BSN, [employer] Primary Care Treasa Bradshaw: PATIENT, NO Other Clinician: Referring Levester Waldridge: Rolland Porter Treating Chalmers Iddings/Extender: Linwood Dibbles, HOYT Weeks in Treatment: 0 Activities of Daily Living Items Answer Activities of Daily Living (Please select one for each item) Drive Automobile Need Assistance Take Medications Completely Able Use Telephone Completely Able Care for Appearance Completely Able Use Toilet Completely Able Bath / Shower Completely Able Dress Self Completely Able Feed Self Completely Able Walk Completely Able Get In / Out Bed Completely Able Housework Completely Able Prepare Meals Completely Able Handle  Money Completely Able Shop for Self Completely Able Electronic Signature(s) Signed: 03/12/2017 4:36:00 PM By: Elpidio Eric BSN, RN Entered By: Elpidio Eric on 03/12/2017 09:21:57 Cindy [name] ([id number]) -------------------------------------------------------------------------------- Education Assessment Details Patient Name: Cindy [name] Date of Service: 03/12/2017 9:00 AM Medical Record Number: [id number] Patient Account Number: [id number] Date of Birth/Sex: [birth year] (41 y.o. Female) Treating RN: Afful, RN, BSN, [employer] Primary Care Akari Defelice: PATIENT, NO Other Clinician: Referring Goro Wenrick: Rolland Porter Treating Gaines Cartmell/Extender: Linwood Dibbles, HOYT Weeks in Treatment: 0 Primary Learner Assessed: Caregiver brother Reason Patient is not Primary Learner: Language BArrier Learning Preferences/Education Level/Primary Language Learning Preference: Explanation Highest Education Level: High School Preferred Language: Other: Zarma Cognitive Barrier Assessment/Beliefs Language Barrier: Corporate investment banker Needed: Yes Memory Deficit: No Emotional Barrier: No Cultural/Religious Beliefs Affecting Medical No Care: Physical Barrier Assessment Impaired Vision: No Impaired Hearing: No Decreased Hand dexterity: No Knowledge/Comprehension Assessment Knowledge Level: Medium Comprehension Level: Medium Ability to understand written Medium instructions: Ability to understand verbal Medium instructions: Motivation Assessment Anxiety Level: Calm Cooperation: Cooperative Education Importance: Acknowledges Need Interest in Health Problems: Asks Questions Perception: Coherent Willingness to Engage in Self- Medium Management Activities: Readiness to Engage in Self- Medium Management Activities: Cindy [name], Cindy [name] ([id number]) Electronic Signature(s) Signed: 03/12/2017 9:25:54 AM By: Elpidio Eric BSN, RN Entered By: Elpidio Eric on 03/12/2017 09:25:54 Cindy [name]  ([id number]) -------------------------------------------------------------------------------- Fall Risk Assessment Details Patient Name: Cindy [name] Date of Service: 03/12/2017 9:00 AM Medical Record Number: [id number] Patient Account Number: [id number] Date of Birth/Sex: 1975/12/23 (41 y.o. Female) Treating RN: Afful, RN, BSN, [employer] Primary Care Kamaiyah Uselton: PATIENT, NO Other Clinician: Referring Duchess Armendarez: Rolland Porter Treating Lillyonna Armstead/Extender: Linwood Dibbles, HOYT Weeks in Treatment: 0 Fall Risk Assessment Items Have you had 2 or more falls in the last 12 monthso 0 No Have you had any fall that resulted in injury in the last 12 monthso 0 No FALL RISK ASSESSMENT: History of falling - immediate or within 3  months 0 No Secondary diagnosis 0 No Ambulatory aid None/bed rest/wheelchair/nurse 0 Yes Crutches/cane/walker 0 No Furniture 0 No IV Access/Saline Lock 0 No Gait/Training Normal/bed rest/immobile 0 Yes Weak 0 No Impaired 0 No Mental Status Oriented to own ability 0 Yes Electronic Signature(s) Signed: 03/12/2017 9:26:06 AM By: Elpidio Eric BSN, RN Entered By: Elpidio Eric on 03/12/2017 09:26:05 Cindy [name] ([id number]) -------------------------------------------------------------------------------- Foot Assessment Details Patient Name: Cindy [name] Date of Service: 03/12/2017 9:00 AM Medical Record Number: [id number] Patient Account Number: [id number] Date of Birth/Sex: 03/31/1976 (41 y.o. Female) Treating RN: Afful, RN, BSN, [employer] Primary Care Leontyne Manville: PATIENT, NO Other Clinician: Referring Estie Sproule: Rolland Porter Treating Declyn Offield/Extender: Linwood Dibbles, HOYT Weeks in Treatment: 0 Foot Assessment Items Site Locations + = Sensation present, - = Sensation absent, C = Callus, U = Ulcer R = Redness, W = Warmth, M = Maceration, PU = Pre-ulcerative lesion F = Fissure, S = Swelling, D = Dryness Assessment Right: [name]: Other Deformity: No No Prior Foot Ulcer: No No Prior Amputation:  No No Charcot Joint: No No Ambulatory Status: Ambulatory Without Help Gait: Steady Electronic Signature(s) Signed: 03/12/2017 9:26:21 AM By: Elpidio Eric BSN, RN Entered By: Elpidio Eric on 03/12/2017 09:26:21 Cindy [name] ([id number]) -------------------------------------------------------------------------------- Nutrition Risk Assessment Details Patient Name: Cindy [name] Date of Service: 03/12/2017 9:00 AM Medical Record Number: [id number] Patient Account Number: [id number] Date of Birth/Sex: Feb 23, 1976 (41 y.o. Female) Treating RN: Afful, RN, BSN, [employer] Primary Care Emmali Karow: PATIENT, NO Other Clinician: Referring Emylia Latella: Rolland Porter Treating Vieva Brummitt/Extender: Linwood Dibbles, HOYT Weeks in Treatment: 0 Height (in): 73 Weight (lbs): 218 Body Mass Index (BMI): 28.8 Nutrition Risk Assessment Items NUTRITION RISK SCREEN: I have an illness or condition that made me change the kind and/or 0 No amount of food I eat I eat fewer than two meals per day 0 No I eat few fruits and vegetables, or milk products 0 No I have three or more drinks of beer, liquor or wine almost every day 0 No I have tooth or mouth problems that make it hard for me to eat 0 No I don't always have enough money to buy the food I need 0 No I eat alone most of the time 0 No I take three or more different prescribed or over-the-counter drugs a 0 No day Without wanting to, I have lost or gained 10 pounds in the last six 0 No months I am not always physically able to shop, cook and/or feed myself 0 No Nutrition Protocols Good Risk Protocol 0 No interventions needed Moderate Risk Protocol Electronic Signature(s) Signed: 03/12/2017 9:26:11 AM By: Elpidio Eric BSN, RN Entered By: Elpidio Eric on 03/12/2017 09:26:11

## 2017-03-22 IMAGING — US US MFM OB DETAIL+14 WK
1 series · 14 of 28 positions shown · non-contrast
Comparison: none

[Series 1: us mfm ob detail+14 wk · 14 of 92 slices shown]
[im 4/92]
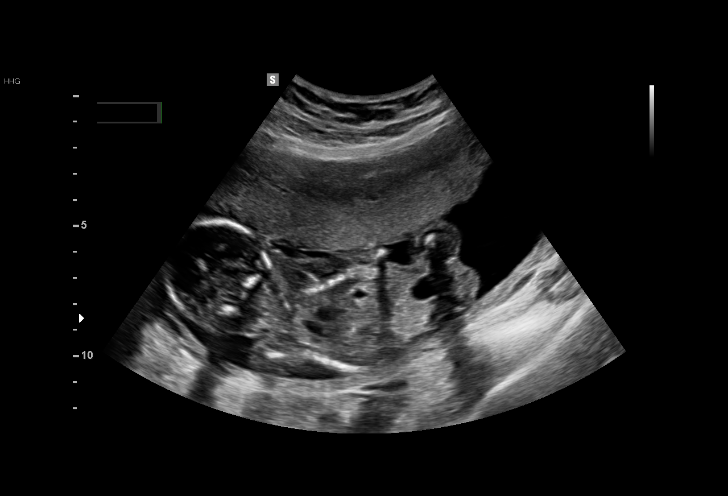
[im 11/92]
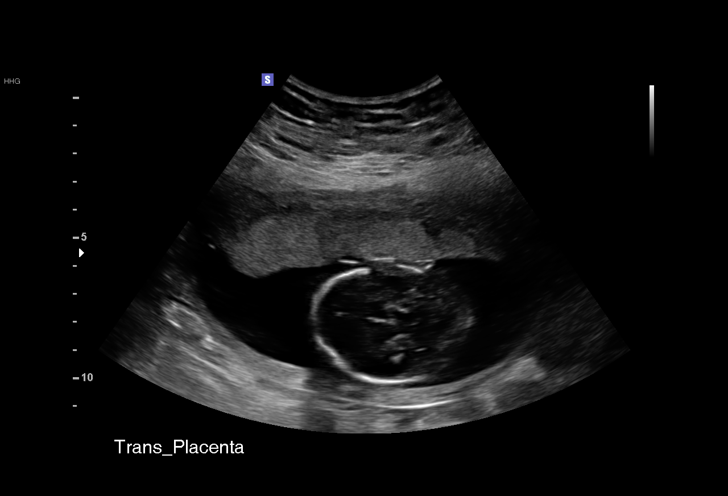
[im 17/92]
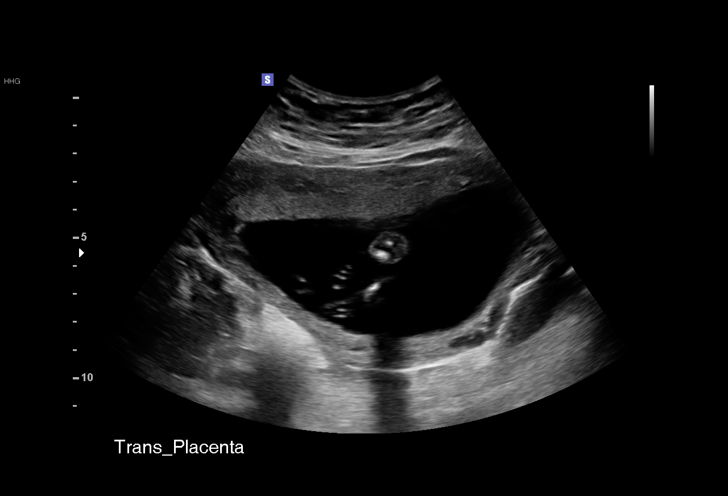
[im 24/92]
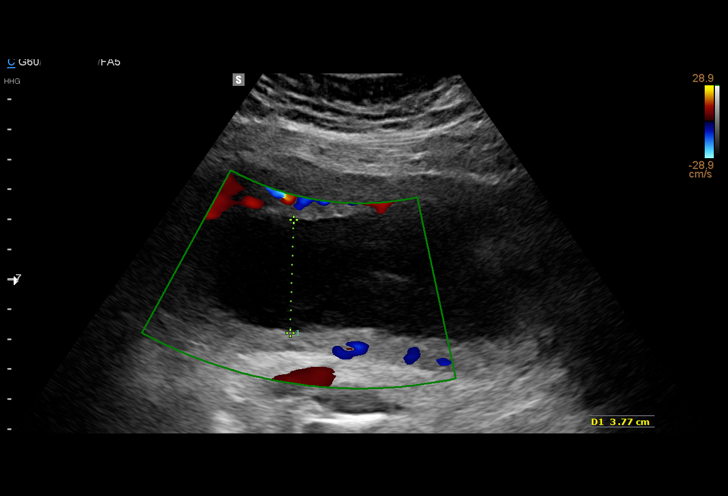
[im 31/92]
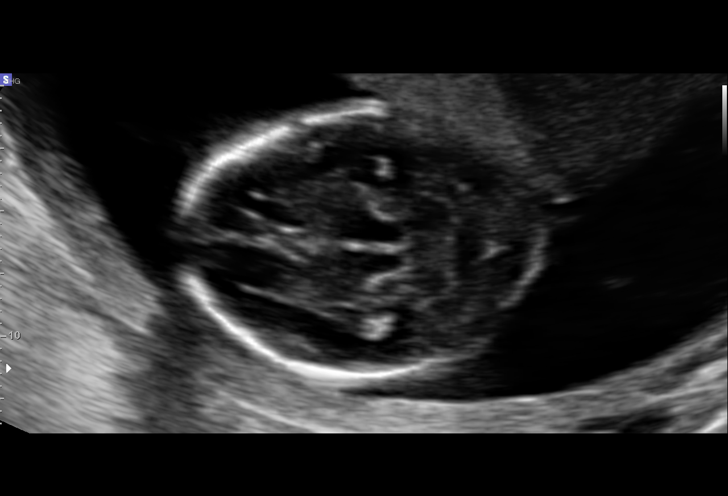
[im 38/92]
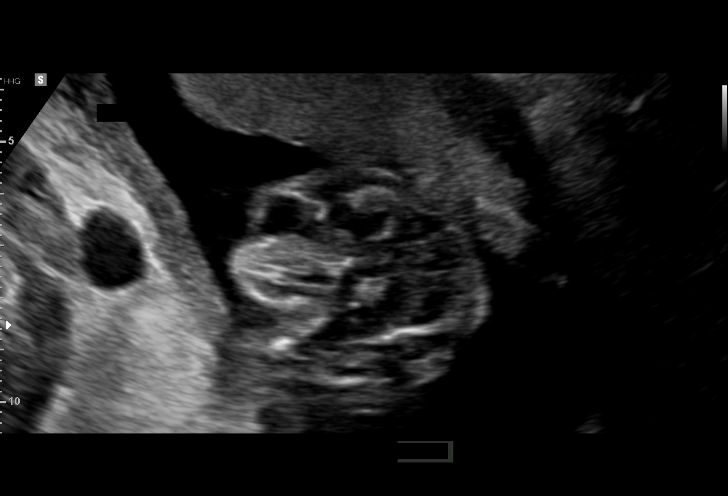
[im 44/92]
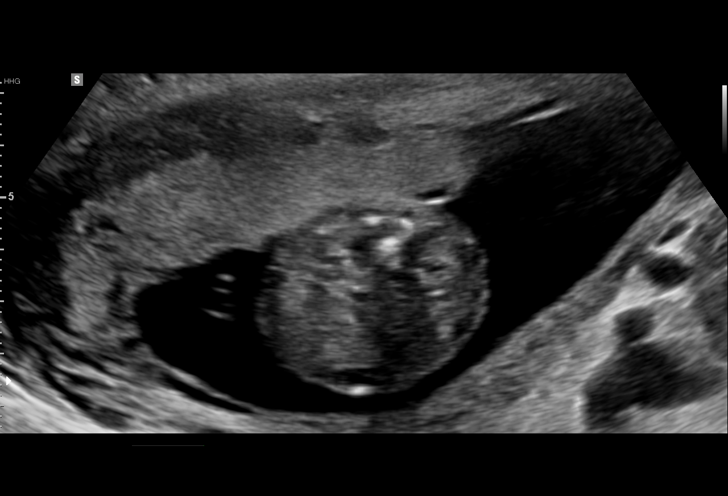
[im 51/92]
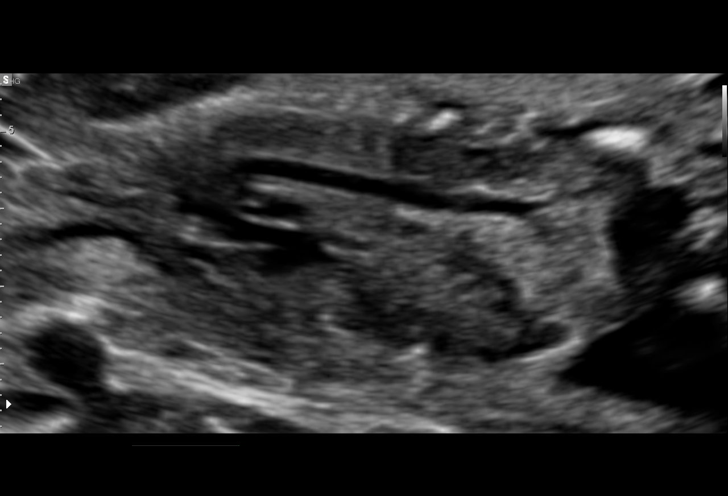
[im 58/92]
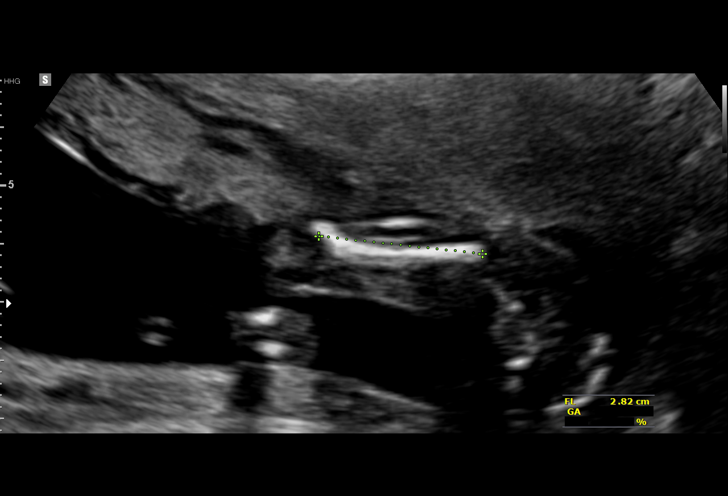
[im 65/92]
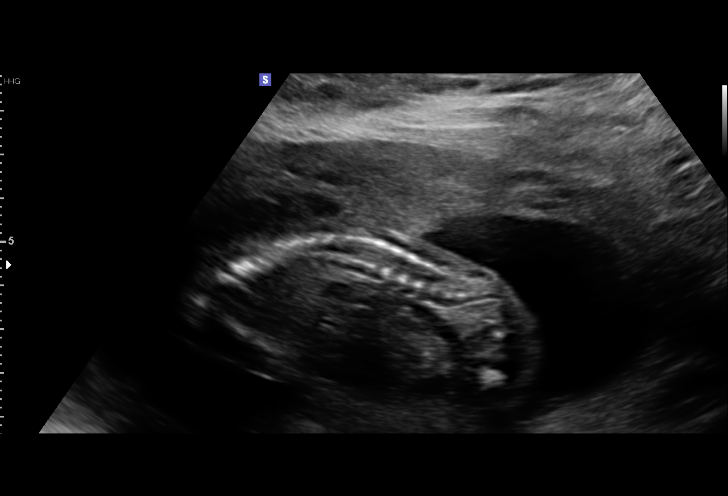
[im 71/92]
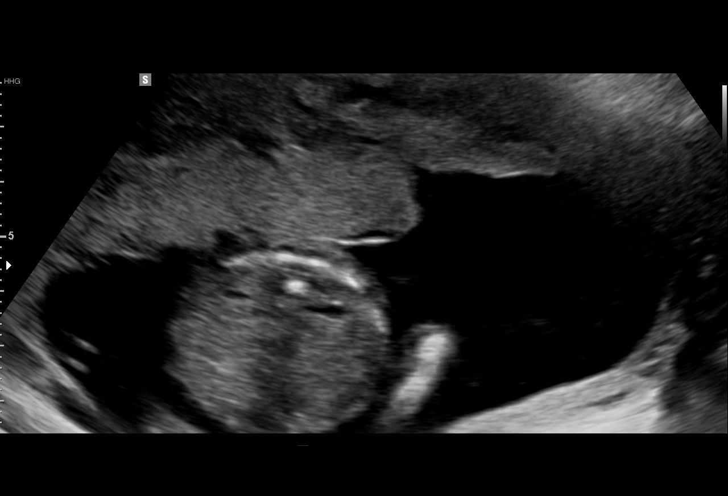
[im 78/92]
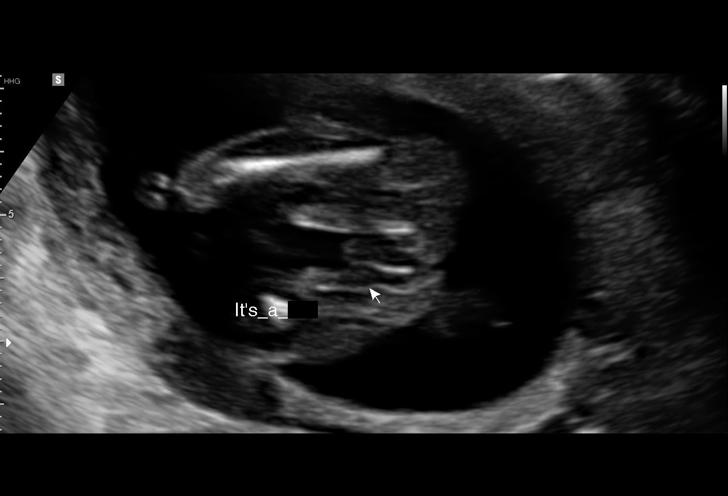
[im 85/92]
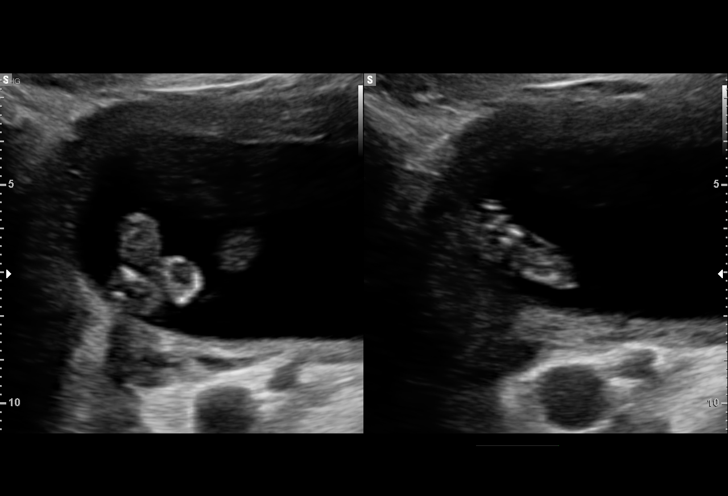
[im 92/92]
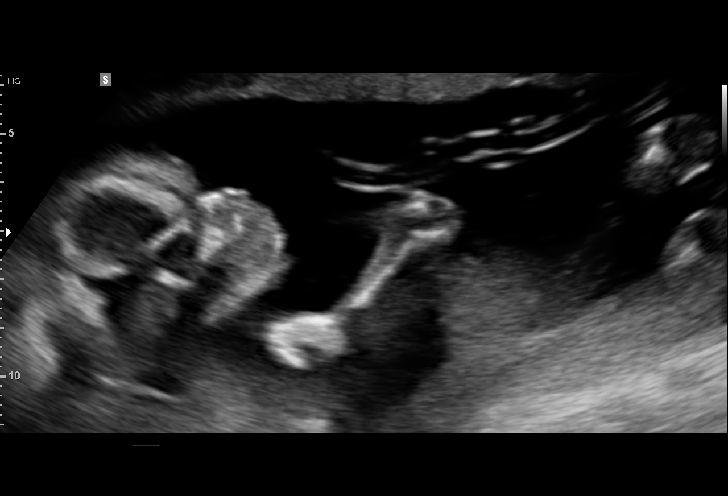

[14 of 28 positions shown; findings below may reference images not displayed]

1  RAHUL OCONNELL             [id number]      [id number]     [id number]
Indications

19 weeks gestation of pregnancy
Detailed fetal anatomic survey                 Z36
Advanced maternal age multigravida 35+,
second trimester
OB History

Blood Type:   222      Height:  5'9"   Weight (lb):  222      BMI:
Gravidity:    5         Term:   3
Fetal Evaluation

Num Of Fetuses:     1
Fetal Heart         144
Rate(bpm):
Cardiac Activity:   Observed
Presentation:       Breech
Placenta:           Anterior, above cervical os
P. Cord Insertion:  Visualized, central

Amniotic Fluid
AFI FV:      Subjectively within normal limits

Largest Pocket(cm)
3.8
Biometry

BPD:      42.3  mm     G. Age:  18w 6d         42  %    CI:        74.05   %   70 - 86
FL/HC:      18.2   %   16.1 -
HC:      156.1  mm     G. Age:  18w 4d         22  %    HC/AC:      1.13       1.09 -
AC:      137.7  mm     G. Age:  19w 1d         52  %    FL/BPD:     67.1   %
FL:       28.4  mm     G. Age:  18w 5d         33  %    FL/AC:      20.6   %   20 - 24
HUM:      26.1  mm     G. Age:  18w 1d         29  %
CER:      19.2  mm     G. Age:  18w 4d         38  %
NFT:       4.8  mm

CM:        4.5  mm
Est. FW:     264  gm      0 lb 9 oz     43  %
Gestational Age

LMP:           17w 0d       Date:   01/08/16                 EDD:   10/14/16
U/S Today:     18w 6d                                        EDD:   10/01/16
Best:          19w 0d    Det. By:   Previous Ultrasound      EDD:   09/30/16
(04/16/16)
Anatomy

Cranium:               Appears normal         Aortic Arch:            Appears normal
Cavum:                 Appears normal         Ductal Arch:            Not well visualized
Ventricles:            Appears normal         Diaphragm:              Appears normal
Choroid Plexus:        Appears normal         Stomach:                Appears normal, left
sided
Cerebellum:            Appears normal         Abdomen:                Appears normal
Posterior Fossa:       Appears normal         Abdominal Wall:         Appears nml (cord
insert, abd wall)
Face:                  Orbits appear          Cord Vessels:           Appears normal (3
normal                                         vessel cord)
Lips:                  Not well visualized    Kidneys:                Appear normal
Thoracic:              Appears normal         Bladder:                Appears normal
Heart:                 Appears normal (       Spine:                  Appears normal
axis, and situs)
RVOT:                  Not well visualized    Upper Extremities:      Appears normal
LVOT:                  Not well visualized    Lower Extremities:      Appears normal

Other:  Fetus appears to be a female. Nasal bone visualized. Technically
difficult due to maternal habitus and fetal position.
Cervix Uterus Adnexa

Cervix
Length:            5.2  cm.
Normal appearance by transabdominal scan.

Left Ovary
Size(cm)       3.1 x    1.7    x  2.5       Vol(ml):
Within normal limits.

Right Ovary
Size(cm)       2.8 x     2     x  3.3       Vol(ml):
Within normal limits.

Adnexa:       No adnexal mass visualized.
Impression

Single IUP at 19w 0d
Advanced maternal age
Limited views of the fetal heart and face were obtained
The remainder of the fetal anatomy appears normal
No markers associated with aneuploidy were noted
Anterior placenta without previa
Normal amniotic fluid volume
Recommendations

See separate note from Genetics counselor
Recommend follow up ultrasound in 4 weeks to reevaluate
the fetal face and heart

## 2017-07-24 IMAGING — US US MFM OB FOLLOW-UP
1 series · 13 of 28 positions shown · non-contrast
Comparison: none

[Series 1: us mfm ob follow-up · 50 acquisitions, 13 frames shown]
[im 2/50]
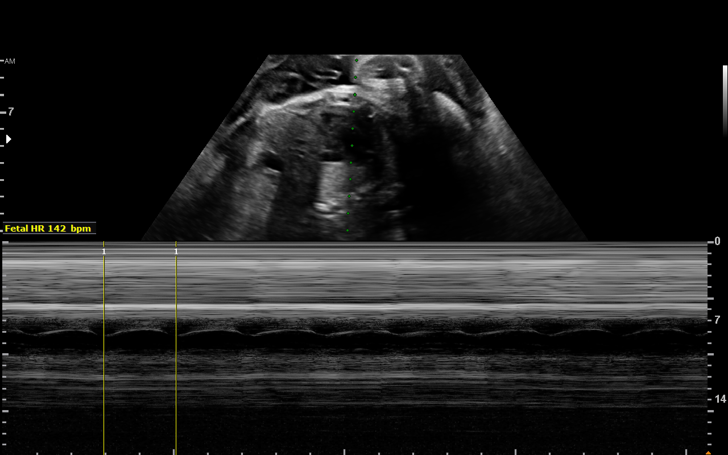
[im 6/50]
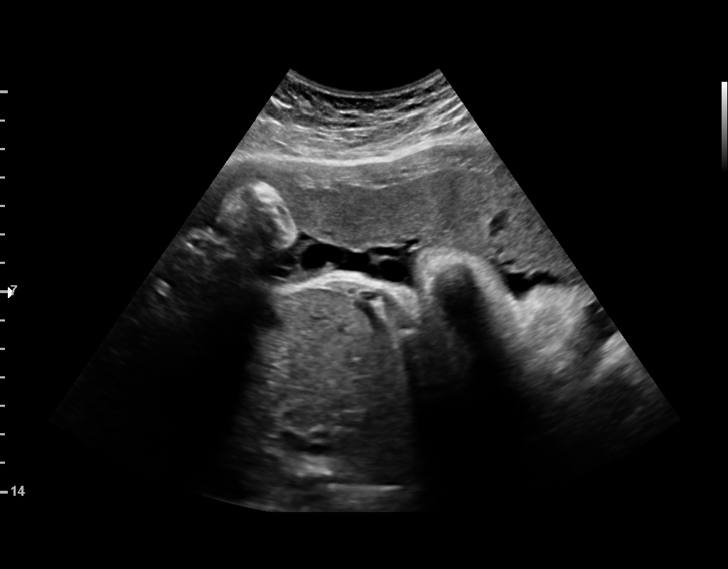
[im 10/50]
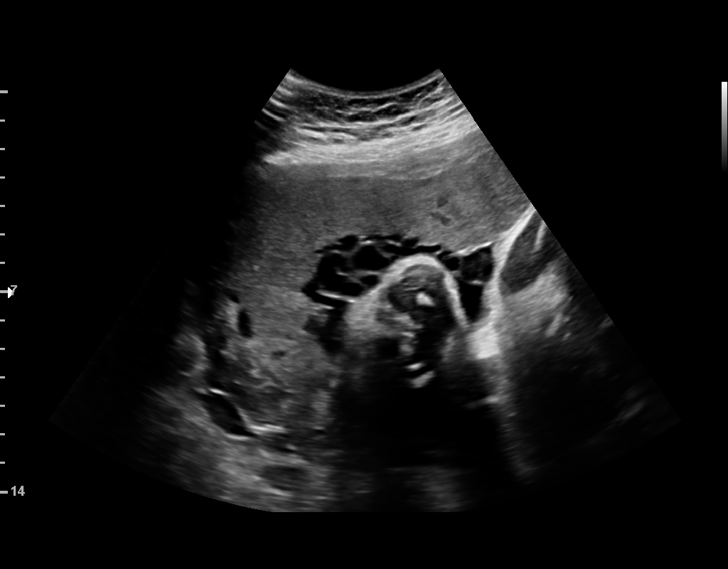
[im 13/50]
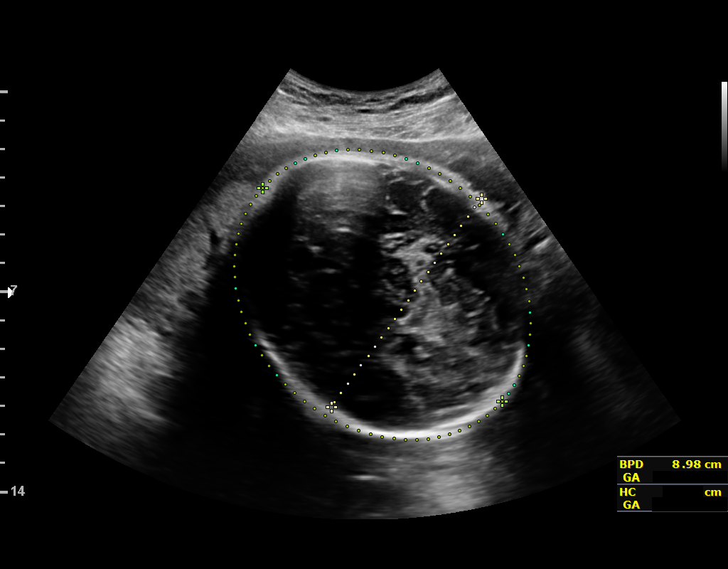
[im 17/50]
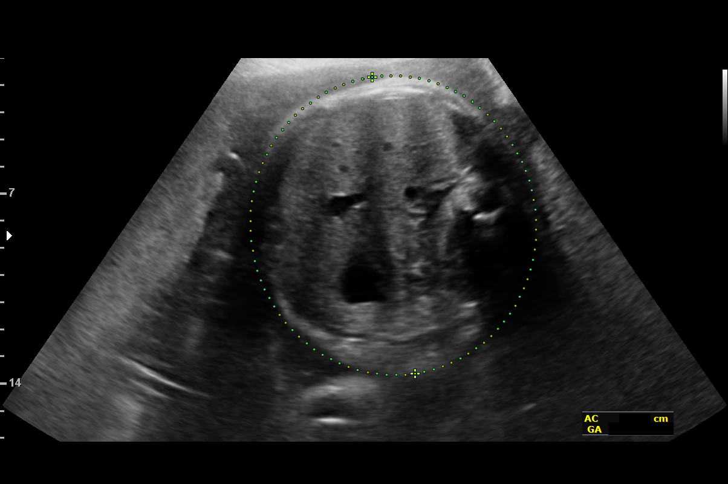
[im 20/50]
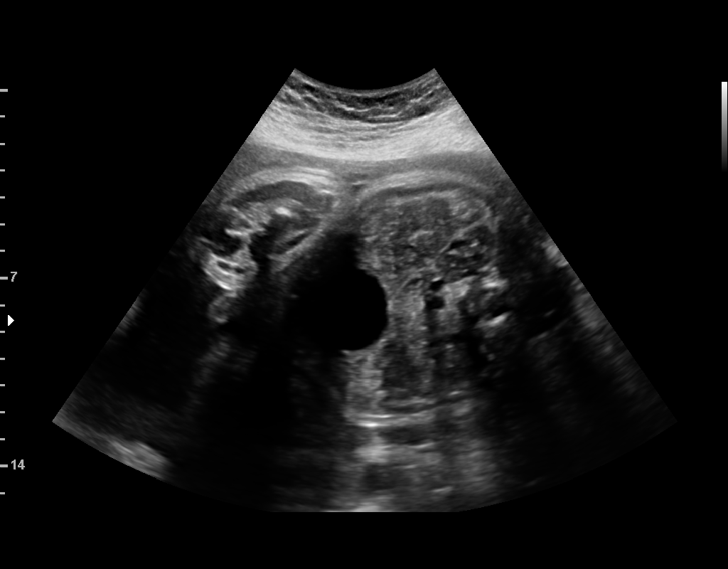
[im 26/50]
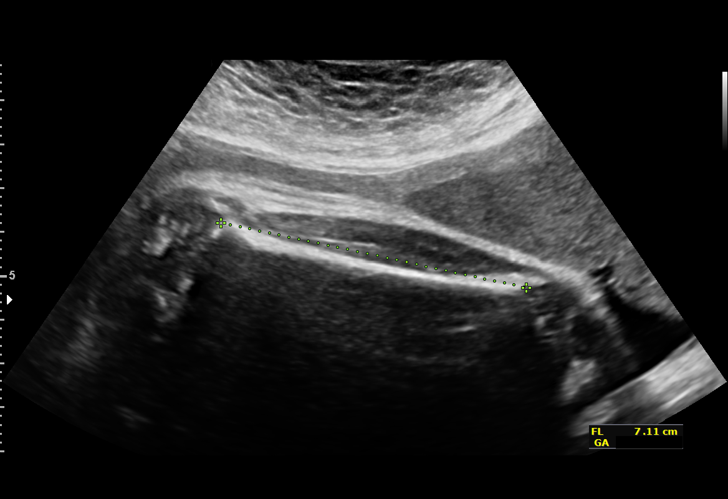
[im 30/50]
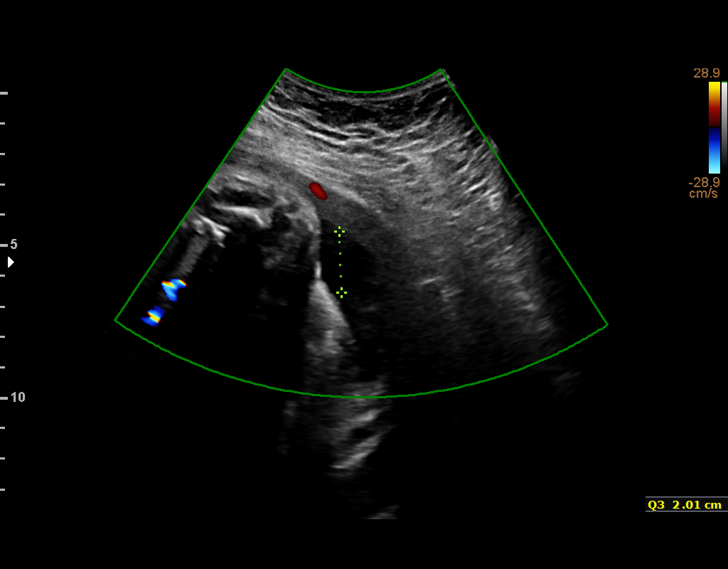
[im 33/50]
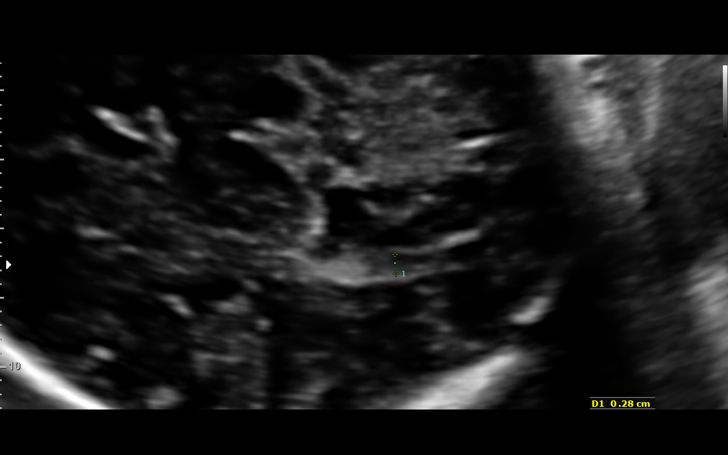
[im 37/50]
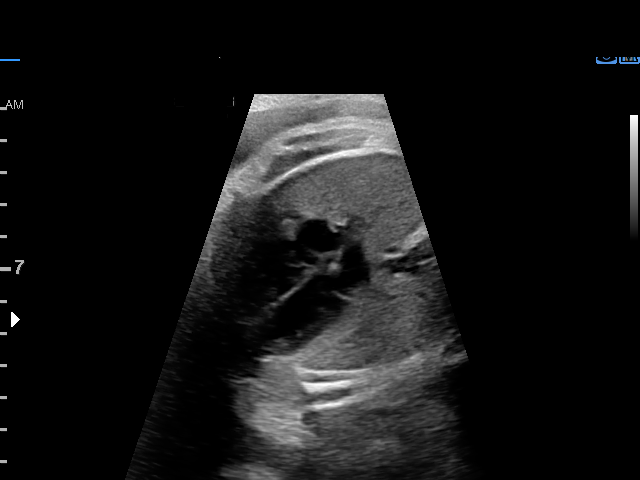
[im 40/50]
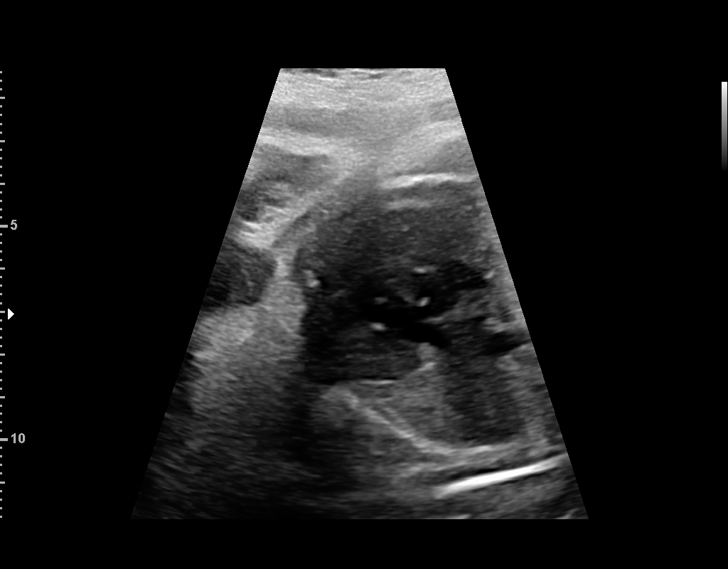
[im 44/50]
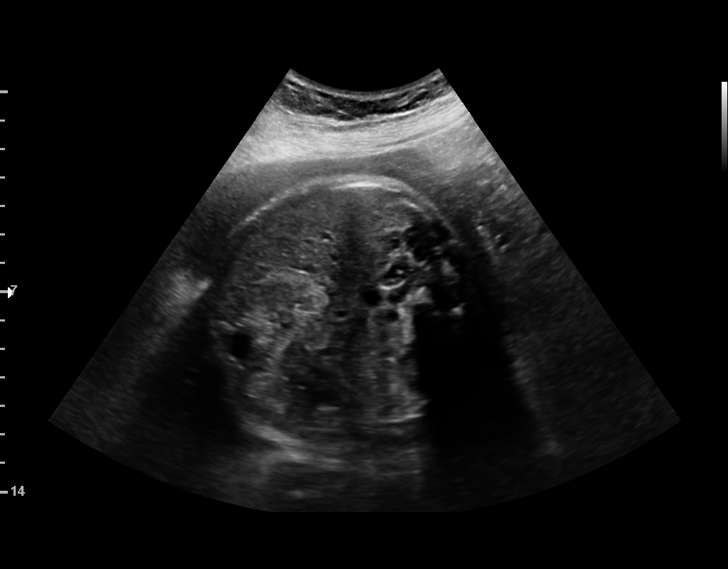
[im 48/50]
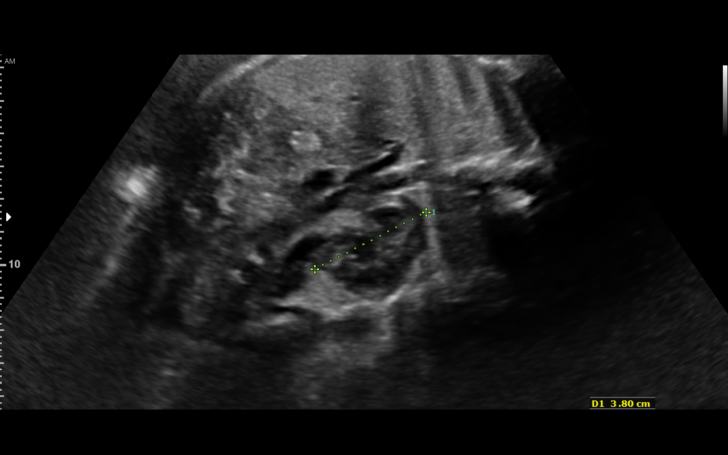

[13 of 28 positions shown; findings below may reference images not displayed]

Indications

36 weeks gestation of pregnancy
Advanced maternal age multigravida 35+,
third trimester (declined testing)
Obesity complicating pregnancy, third
trimester
OB History

Blood Type:   222      Height:  5'9"   Weight (lb):  222      BMI:
Gravidity:    5         Term:   3
Fetal Evaluation

Num Of Fetuses:     1
Fetal Heart         142
Rate(bpm):
Cardiac Activity:   Observed
Presentation:       Cephalic
Placenta:           Anterior, above cervical os
P. Cord Insertion:  Visualized, central

Amniotic Fluid
AFI FV:      Subjectively within normal limits

AFI Sum(cm)     %Tile       Largest Pocket(cm)
13.04           46

RUQ(cm)       RLQ(cm)       LUQ(cm)        LLQ(cm)
8.32
Biometry
BPD:        89  mm     G. Age:  36w 0d         43  %    CI:        75.95   %   70 - 86
FL/HC:      22.0   %   20.8 -
HC:      323.7  mm     G. Age:  36w 4d         22  %    HC/AC:      0.97       0.92 -
AC:      332.9  mm     G. Age:  37w 1d         76  %    FL/BPD:     79.9   %   71 - 87
FL:       71.1  mm     G. Age:  36w 3d         41  %    FL/AC:      21.4   %   20 - 24

Est. FW:    2923  gm    6 lb 11 oz      70  %
Gestational Age

LMP:           34w 5d       Date:   01/08/16                 EDD:   10/14/16
U/S Today:     36w 4d                                        EDD:   10/01/16
Best:          36w 5d    Det. By:   Previous Ultrasound      EDD:   09/30/16
(04/16/16)
Anatomy

Cranium:               Appears normal         Aortic Arch:            Previously seen
Cavum:                 Appears normal         Ductal Arch:            Previously seen
Ventricles:            Appears normal         Diaphragm:              Appears normal
Choroid Plexus:        Previously seen        Stomach:                Appears normal, left
sided
Cerebellum:            Previously seen        Abdomen:                Appears normal
Posterior Fossa:       Previously seen        Abdominal Wall:         Previously seen
Nuchal Fold:           Not applicable (>20    Cord Vessels:           Previously seen
wks GA)
Face:                  Orbits and profile     Kidneys:                Appear normal
previously seen
Lips:                  Previously seen        Bladder:                Appears normal
Thoracic:              Appears normal         Spine:                  Previously seen
Heart:                 Appears normal (       Upper Extremities:      Previously seen
axis, and situs)
RVOT:                  Appears normal         Lower Extremities:      Previously seen
LVOT:                  Appears normal

Other:  Fetus appears to be a MALE. Nasal bone visualized. Technically
difficult due to advanced GA and fetal position.
Cervix Uterus Adnexa

Cervix
Not visualized (advanced GA >47wks)

Uterus
No abnormality visualized.

Left Ovary
Not visualized.

Right Ovary
Not visualized.

Adnexa:       No abnormality visualized.
Impression

Singleton intrauterine pregnancy at 36+5 weeks with
advanced maternal age
Review of the anatomy shows no sonographic markers for
aneuploidy or structural anomalies
However, evaluations should be considered suboptimal
secondary to advanced gestational age and fetal position
Amniotic fluid volume is normal with an AFI of 13cm
Estimated fetal weight is 3037g which is growth in the 70th
percentile
Recommendations

Stable growth and development. no changes necessary in
the obsteric regimen. Follow-up ultrasounds as clinically
indicated.

## 2018-05-08 ENCOUNTER — Emergency Department (HOSPITAL_COMMUNITY): Payer: Medicaid Other

## 2018-05-08 ENCOUNTER — Emergency Department (HOSPITAL_COMMUNITY)
Admission: EM | Admit: 2018-05-08 | Discharge: 2018-05-08 | Disposition: A | Discharge status: Home / Self Care | Payer: Medicaid Other | Attending: Emergency Medicine | Admitting: Emergency Medicine

## 2018-05-08 DIAGNOSIS — R0789 Other chest pain: Secondary | ICD-10-CM | POA: Diagnosis not present

## 2018-05-08 DIAGNOSIS — N739 Female pelvic inflammatory disease, unspecified: Secondary | ICD-10-CM | POA: Insufficient documentation

## 2018-05-08 DIAGNOSIS — N730 Acute parametritis and pelvic cellulitis: Secondary | ICD-10-CM

## 2018-05-08 DIAGNOSIS — R079 Chest pain, unspecified: Secondary | ICD-10-CM | POA: Diagnosis present

## 2018-05-08 LAB — COMPREHENSIVE METABOLIC PANEL
ALK PHOS: 39 U/L (ref 38–126)
ALT: 17 U/L (ref 14–54)
AST: 18 U/L (ref 15–41)
Albumin: 3.6 g/dL (ref 3.5–5.0)
Anion gap: 7 (ref 5–15)
BUN: 11 mg/dL (ref 6–20)
CALCIUM: 9 mg/dL (ref 8.9–10.3)
CO2: 25 mmol/L (ref 22–32)
Chloride: 106 mmol/L (ref 101–111)
Creatinine, Ser: 0.78 mg/dL (ref 0.44–1.00)
Glucose, Bld: 94 mg/dL (ref 65–99)
Potassium: 3.7 mmol/L (ref 3.5–5.1)
Sodium: 138 mmol/L (ref 135–145)
Total Bilirubin: 0.7 mg/dL (ref 0.3–1.2)
Total Protein: 6.9 g/dL (ref 6.5–8.1)

## 2018-05-08 LAB — WET PREP, GENITAL
Clue Cells Wet Prep HPF POC: NONE SEEN
Sperm: NONE SEEN
Trich, Wet Prep: NONE SEEN
Yeast Wet Prep HPF POC: NONE SEEN

## 2018-05-08 LAB — URINALYSIS, ROUTINE W REFLEX MICROSCOPIC
BACTERIA UA: NONE SEEN
BILIRUBIN URINE: NEGATIVE
Glucose, UA: NEGATIVE mg/dL
Hgb urine dipstick: NEGATIVE
KETONES UR: NEGATIVE mg/dL
LEUKOCYTES UA: NEGATIVE
Nitrite: NEGATIVE
Protein, ur: NEGATIVE mg/dL
Specific Gravity, Urine: 1.004 — ABNORMAL LOW (ref 1.005–1.030)
pH: 6 (ref 5.0–8.0)

## 2018-05-08 LAB — CBC
HCT: 40.5 % (ref 36.0–46.0)
Hemoglobin: 12.9 g/dL (ref 12.0–15.0)
MCH: 27.5 pg (ref 26.0–34.0)
MCHC: 31.9 g/dL (ref 30.0–36.0)
MCV: 86.4 fL (ref 78.0–100.0)
PLATELETS: 219 10*3/uL (ref 150–400)
RBC: 4.69 MIL/uL (ref 3.87–5.11)
RDW: 12.7 % (ref 11.5–15.5)
WBC: 4.7 10*3/uL (ref 4.0–10.5)

## 2018-05-08 LAB — I-STAT BETA HCG BLOOD, ED (MC, WL, AP ONLY)

## 2018-05-08 LAB — LIPASE, BLOOD: Lipase: 35 U/L (ref 11–51)

## 2018-05-08 LAB — I-STAT TROPONIN, ED: Troponin i, poc: 0.02 ng/mL (ref 0.00–0.08)

## 2018-05-08 MED ORDER — CEFTRIAXONE SODIUM 250 MG IJ SOLR
250.0000 mg | Freq: Once | INTRAMUSCULAR | Status: AC
  Administered 2018-05-08: 250 mg via INTRAMUSCULAR
  Filled 2018-05-08: qty 250

## 2018-05-08 MED ORDER — GI COCKTAIL ~~LOC~~
30.0000 mL | Freq: Once | ORAL | Status: AC
  Administered 2018-05-08: 30 mL via ORAL
  Filled 2018-05-08: qty 30

## 2018-05-08 MED ORDER — KETOROLAC TROMETHAMINE 30 MG/ML IJ SOLN
30.0000 mg | Freq: Once | INTRAMUSCULAR | Status: AC
  Administered 2018-05-08: 30 mg via INTRAVENOUS
  Filled 2018-05-08: qty 1

## 2018-05-08 MED ORDER — ACETAMINOPHEN 500 MG PO TABS: 500.0000 mg | ORAL_TABLET | Freq: Four times a day (QID) | ORAL | 0 refills | Status: DC | PRN

## 2018-05-08 MED ORDER — IBUPROFEN 600 MG PO TABS: 600.0000 mg | ORAL_TABLET | Freq: Four times a day (QID) | ORAL | 0 refills | Status: DC | PRN

## 2018-05-08 MED ORDER — RANITIDINE HCL 150 MG PO TABS: 150.0000 mg | ORAL_TABLET | Freq: Two times a day (BID) | ORAL | 0 refills | Status: AC

## 2018-05-08 MED ORDER — DOXYCYCLINE HYCLATE 100 MG PO CAPS: 100.0000 mg | ORAL_CAPSULE | Freq: Two times a day (BID) | ORAL | 0 refills | Status: AC

## 2018-05-08 MED ORDER — FAMOTIDINE IN NACL 20-0.9 MG/50ML-% IV SOLN
20.0000 mg | Freq: Once | INTRAVENOUS | Status: AC
  Administered 2018-05-08: 20 mg via INTRAVENOUS
  Filled 2018-05-08: qty 50

## 2018-05-08 MED ORDER — STERILE WATER FOR INJECTION IJ SOLN
INTRAMUSCULAR | Status: AC
  Administered 2018-05-08: 10 mL
  Filled 2018-05-08: qty 10

## 2018-05-08 MED ORDER — SODIUM CHLORIDE 0.9 % IV BOLUS
1000.0000 mL | Freq: Once | INTRAVENOUS | Status: AC
  Administered 2018-05-08: 1000 mL via INTRAVENOUS

## 2018-05-08 NOTE — ED Provider Notes (Signed)
[facility] EMERGENCY DEPARTMENT Provider Note   CSN: [id number] Arrival date & time: 05/08/18  1538     History   Chief Complaint Chief Complaint  Patient presents with  . Abdominal Pain  . Chest Pain    HPI Cindy [name] is a 42 y.o. 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  Patient primarily speaks Zarma  and her sister-in-law served as Nurse, learning disability today.  She states that she has had a constant tightness to the middle of the chest which will radiate bilaterally sometimes.  No aggravating or alleviating factors noted.  She notes she will at times feel short of breath or nauseous but denies diaphoresis, syncope, lightheadedness, or vomiting.  Pain is nonexertional, non-positional, nonpleuritic and does not worsen after meals.  She denies fevers, chills, or cough.  She is a non-smoker, denies recreational drug use or excessive alcohol intake.  She does not have a history of diabetes, hypertension, hyperlipidemia, or other comorbidities.  She has not tried anything for her symptoms.  She also notes right lower quadrant abdominal painintermittently for 2 weeks which is dull in nature and does not radiate.  She is not sure what brings on the pain but denies any aggravating or alleviating factors.  She notes some dysuria and vaginal itching but denies abnormal vaginal bleeding, discharge, diarrhea, constipation, melena, hematochezia, hematuria, urgency, or frequency.  She has not tried anything for her symptoms.  Patient has an 82-month-old child at home who she states she picks up frequently and carries on her back.  She thinks this may be contributing to some of her symptoms.  The history is provided by the patient. The history is limited by a language barrier. A language interpreter was used.    Past Medical History:  Diagnosis Date  .  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 NO PAST SURGERIES       OB History    Gravida  4   Para  4   Term  4   Preterm      AB      Living  4     SAB      TAB      Ectopic      Multiple  0   Live Births  1            Home Medications    Prior to Admission medications   Medication Sig Start Date End Date Taking? Authorizing Provider  acetaminophen (TYLENOL) 500 MG tablet Take 1 tablet (500 mg total) by mouth every 6 (six) hours as needed. 05/08/18   Mallika Sanmiguel A, PA-C  doxycycline (VIBRAMYCIN) 100 MG capsule Take 1 capsule (100 mg total) by mouth 2 (two) times daily for 14 days. 05/08/18 05/22/18  Michela Pitcher A, PA-C  HYDROcodone-acetaminophen (NORCO/VICODIN) 5-325 MG tablet Take 2 tablets by mouth every 4 (four) hours as needed. Patient not taking: Reported on 05/08/2018 03/11/17   Rolland Porter, MD  ibuprofen (ADVIL,MOTRIN) 600 MG tablet Take 1 tablet (600 mg total) by mouth every 6 (six) hours as needed. 05/08/18   Yeni Jiggetts A, PA-C  Prenatal Multivit-Min-Fe-FA (PRENATAL VITAMINS) 0.8 MG tablet  Take 1 tablet by mouth daily. Patient not taking: Reported on 11/24/2016 02/07/16   Morrell Riddle, PA-C  ranitidine (ZANTAC) 150 MG tablet Take 1 tablet (150 mg total) by mouth 2 (two) times daily. 05/08/18   Jeanie Sewer, PA-C    Family History No family history on file.  Social History Social History   Tobacco Use  . Smoking status: Never Smoker  . Smokeless tobacco: Never Used  Substance Use Topics  . Alcohol use: No    Alcohol/week: 0.0 oz  . Drug use: No     Allergies   Patient has no known allergies.   Review of Systems Review of Systems  Constitutional: Negative for chills, diaphoresis, fatigue and fever.  Respiratory: Positive for shortness of breath. Negative for cough.    Cardiovascular: Positive for chest pain.  Gastrointestinal: Positive for abdominal pain and nausea. Negative for blood in stool, constipation, diarrhea and vomiting.  Genitourinary: Negative for dysuria, hematuria, vaginal bleeding, vaginal discharge and vaginal pain.  Neurological: Negative for syncope.  All other systems reviewed and are negative.    Physical Exam Updated Vital Signs BP 113/76   Pulse (!) 58   Temp 98.2 F (36.8 C) (Oral)   Resp 16   LMP 04/26/2018   SpO2 100%   Physical Exam  Constitutional: She appears well-developed and well-nourished. No distress.  HENT:  Head: Normocephalic and atraumatic.  Eyes: Conjunctivae are normal. Right eye exhibits no discharge. Left eye exhibits no discharge.  Neck: No JVD present. No tracheal deviation present.  Cardiovascular: Normal rate, regular rhythm, normal heart sounds and intact distal pulses.  2+ radial and DP/PT pulses bilaterally, Homans sign absent bilaterally, no lower extremity edema, no palpable cords, compartments are soft.  There is tenderness to palpation of the chest wall anteriorly and along the costal margin with no deformity, ecchymosis, crepitus, or flail segment noted.  Pulmonary/Chest: Effort normal and breath sounds normal.  Abdominal: Soft. Normal appearance and bowel sounds are normal. She exhibits no distension. There is no hepatosplenomegaly. There is tenderness in the right upper quadrant and right lower quadrant. There is no rigidity, no rebound, no guarding, no CVA tenderness, no tenderness at McBurney's point and negative Murphy's sign.  Genitourinary: Vagina normal and uterus normal. Uterus is not tender. Cervix exhibits motion tenderness and discharge. Cervix exhibits no friability. Right adnexum displays tenderness. Right adnexum displays no mass and no fullness.  Genitourinary Comments: Examination performed in the presence of chaperone.  No masses or lesions to the external genitalia.  Moderate  amount of yellow-white discharge in the vaginal vault.  No bleeding.  Musculoskeletal: She exhibits no edema.  No midline lumbar spine tenderness, bilateral paralumbar muscle tenderness with no deformity, crepitus, or step-off noted.  5/5 strength of BLE major muscle groups.  Neurological: She is alert.  Skin: Skin is warm and dry. No erythema.  Psychiatric: She has a normal mood and affect. Her behavior is normal.  Nursing note and vitals reviewed.    ED Treatments / Results  Labs (all labs ordered are listed, but only abnormal results are displayed) Labs Reviewed  WET PREP, GENITAL - Abnormal; Notable for the following components:      Result Value   WBC, Wet Prep HPF POC FEW (*)    All other components within normal limits  URINALYSIS, ROUTINE W REFLEX MICROSCOPIC - Abnormal; Notable for the following components:   Color, Urine COLORLESS (*)    Specific Gravity, Urine 1.004 (*)    All  other components within normal limits  LIPASE, BLOOD  COMPREHENSIVE METABOLIC PANEL  CBC  RPR  HIV ANTIBODY (ROUTINE TESTING)  I-STAT BETA HCG BLOOD, ED ([facility], [facility], [facility] ONLY)  I-STAT TROPONIN, ED  GC/CHLAMYDIA PROBE AMP ([place]) NOT AT [facility]    EKG EKG Interpretation  Date/Time:  Sunday May 08 2018 16:24:28 EDT Ventricular Rate:  68 PR Interval:  150 QRS Duration: 88 QT Interval:  414 QTC Calculation: 440 R Axis:   65 Text Interpretation:  Normal sinus rhythm Normal ECG Confirmed by Tilden Fossa [phone]) on 05/08/2018 4:41:12 PM   Radiology Dg Chest 2 View  Result Date: 05/08/2018 CLINICAL DATA:  Chest pain. EXAM: CHEST - 2 VIEW COMPARISON:  None. FINDINGS: The heart size and mediastinal contours are within normal limits. Both lungs are clear. The visualized skeletal structures are unremarkable. IMPRESSION: No active cardiopulmonary disease. Electronically Signed   By: Obie Dredge M.D.   On: 05/08/2018 17:08    Procedures Procedures (including critical care  time)  Medications Ordered in ED Medications  sodium chloride 0.9 % bolus 1,000 mL (0 mLs Intravenous Stopped 05/08/18 1858)  ketorolac (TORADOL) 30 MG/ML injection 30 mg (30 mg Intravenous Given 05/08/18 1830)  gi cocktail (Maalox,Lidocaine,Donnatal) (30 mLs Oral Given 05/08/18 1830)  famotidine (PEPCID) IVPB 20 mg premix (0 mg Intravenous Stopped 05/08/18 1859)  cefTRIAXone (ROCEPHIN) injection 250 mg (250 mg Intramuscular Given 05/08/18 2115)  sterile water (preservative free) injection (10 mLs  Given 05/08/18 2115)     Initial Impression / Assessment and Plan / ED Course  I have reviewed the triage vital signs and the nursing notes.  Pertinent labs & imaging results that were available during my care of the patient were reviewed by me and considered in my medical decision making (see chart for details).    Patient presents for evaluation of intermittent chest pain reproducible on palpation for 2 weeks as well as right lower quadrant abdominal pain.  She is afebrile, vital signs are stable, nontoxic and nonseptic in appearance.  Chest pain is reproducible on palpation suggest a musculoskeletal etiology and her symptoms do not sound cardiac in nature. She has a HEART score of 1, making her low risk for MACE chest x-ray shows no acute cardiopulmonary abnormalities, EKG shows normal sinus rhythm with no ischemic changes, troponin is negative. Remainder of lab work reviewed by me shows no leukocytosis, no electrolyte abnormalities.  UA is not concerning for UTI or nephrolithiasis.  Wet prep does show WBCs and she did have cervical motion tenderness concerning for possible PID.  Examination of the abdomen shows no peritoneal signs or guarding. She was also given Pepcid and GI cocktail in the ED with significant improvement in her symptoms.  On reevaluation she states she is feeling much better, tolerating p.o. fluids in the ED, and serial abdominal examinations remain benign.   Suspect musculoskeletal  etiology to her chest pain versus GERD and will treat for PID.  I doubt obstruction, perforation, appendicitis, colitis, or other acute surgical abdominal pathology.  Recommend NSAIDs, Tylenol, Zantac, and 2-week course of doxycycline.  She was given IM Rocephin in the ED as well.  Recommend follow-up with PCP for reevaluation of her symptoms.  Discussed strict ED return precautions.  Patient and patient's sister-in-law verbalized understanding of and agreement with plan and patient stable for discharge home at this time.  Her sister-in-law served as Nurse, learning disability through the duration of the encounter.  Discussed with Dr. Madilyn Hook who agrees with assessment and plan at  this time.  Final Clinical Impressions(s) / ED Diagnoses   Final diagnoses:  PID (acute pelvic inflammatory disease)  Chest wall pain  Atypical chest pain    ED Discharge Orders        Ordered    doxycycline (VIBRAMYCIN) 100 MG capsule  2 times daily     05/08/18 2047    acetaminophen (TYLENOL) 500 MG tablet  Every 6 hours PRN     05/08/18 2047    ibuprofen (ADVIL,MOTRIN) 600 MG tablet  Every 6 hours PRN     05/08/18 2047    ranitidine (ZANTAC) 150 MG tablet  2 times daily     05/08/18 2047       Jeanie SewerFawze, Latoy Labriola A, PA-C 05/09/18 1508    Tilden Fossaees, Elizabeth, MD 05/11/18 1240

## 2018-05-08 NOTE — Discharge Instructions (Addendum)
Please take all of your antibiotics until finished!   You may develop abdominal discomfort or diarrhea from the antibiotic.  You may help offset this with probiotics which you can buy or get in yogurt. Do not eat  or take the probiotics until 2 hours after your antibiotic.   Alternate 600 mg of ibuprofen and [phone] mg of Tylenol every 3 hours as needed for pain. Do not exceed 4000 mg of Tylenol daily.  Take ibuprofen with food to avoid upset stomach issues.  You may also start taking Zantac twice daily with food to see if this will help with your chest pain.  You may discontinue this medication if it does not help your pain.  You can apply a heating pad to the areas that are painful for 20 minutes at a time and do some gentle stretching of the low back and chest to avoid muscle stiffness.  Follow-up with your primary care physician for reevaluation of your chest wall pains and back pains and an OB/GYN for reevaluation of your abdominal pain and vaginal itching.  Return to the emergency department immediately for any concerning signs or symptoms develop such as worsening pain, persisting shortness of breath, high fevers, persistent vomiting.

## 2018-05-08 NOTE — ED Notes (Signed)
Reports lower back pain. Patient has a friend/sis in law to translate for her Primary language is Cindy [name]

## 2018-05-08 NOTE — ED Triage Notes (Signed)
Pt speaks Zarma. Pt arrives for 1 week of right lower abdominal pain, denies diarrhea nausea or vomiting. Endorses dizziness that she "always has for months."; Also c.o. Chest pain and shortness of breath for 1 week. 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 Pt irritable at triage when asked to clarify abdominal tenderness areas.

## 2018-05-09 LAB — HIV ANTIBODY (ROUTINE TESTING W REFLEX): HIV Screen 4th Generation wRfx: NONREACTIVE

## 2018-05-09 LAB — GC/CHLAMYDIA PROBE AMP (~~LOC~~) NOT AT ARMC
Chlamydia: NEGATIVE
Neisseria Gonorrhea: NEGATIVE

## 2018-05-09 LAB — RPR: RPR Ser Ql: NONREACTIVE

## 2020-08-19 ENCOUNTER — Encounter (HOSPITAL_COMMUNITY): Payer: Self-pay

## 2020-08-19 ENCOUNTER — Other Ambulatory Visit: Payer: Self-pay

## 2020-08-19 ENCOUNTER — Emergency Department (HOSPITAL_COMMUNITY)
Admission: EM | Admit: 2020-08-19 | Discharge: 2020-08-19 | Disposition: A | Discharge status: Home / Self Care | Payer: Medicaid Other | Attending: Emergency Medicine | Admitting: Emergency Medicine

## 2020-08-19 ENCOUNTER — Telehealth (HOSPITAL_COMMUNITY): Payer: Self-pay

## 2020-08-19 ENCOUNTER — Emergency Department (HOSPITAL_COMMUNITY): Payer: Medicaid Other

## 2020-08-19 DIAGNOSIS — U071 COVID-19: Secondary | ICD-10-CM | POA: Diagnosis not present

## 2020-08-19 DIAGNOSIS — R509 Fever, unspecified: Secondary | ICD-10-CM | POA: Diagnosis present

## 2020-08-19 LAB — CBC WITH DIFFERENTIAL/PLATELET
Abs Immature Granulocytes: 0.01 10*3/uL (ref 0.00–0.07)
Basophils Absolute: 0 10*3/uL (ref 0.0–0.1)
Basophils Relative: 0 %
Eosinophils Absolute: 0 10*3/uL (ref 0.0–0.5)
Eosinophils Relative: 0 %
HCT: 44.6 % (ref 36.0–46.0)
Hemoglobin: 14.6 g/dL (ref 12.0–15.0)
Immature Granulocytes: 0 %
Lymphocytes Relative: 34 %
Lymphs Abs: 1.3 10*3/uL (ref 0.7–4.0)
MCH: 27.7 pg (ref 26.0–34.0)
MCHC: 32.7 g/dL (ref 30.0–36.0)
MCV: 84.6 fL (ref 80.0–100.0)
Monocytes Absolute: 0.5 10*3/uL (ref 0.1–1.0)
Monocytes Relative: 13 %
Neutro Abs: 1.9 10*3/uL (ref 1.7–7.7)
Neutrophils Relative %: 53 %
Platelets: 180 10*3/uL (ref 150–400)
RBC: 5.27 MIL/uL — ABNORMAL HIGH (ref 3.87–5.11)
RDW: 12.7 % (ref 11.5–15.5)
WBC: 3.7 10*3/uL — ABNORMAL LOW (ref 4.0–10.5)
nRBC: 0 % (ref 0.0–0.2)

## 2020-08-19 LAB — COMPREHENSIVE METABOLIC PANEL
ALT: 14 U/L (ref 0–44)
AST: 21 U/L (ref 15–41)
Albumin: 3.8 g/dL (ref 3.5–5.0)
Alkaline Phosphatase: 52 U/L (ref 38–126)
Anion gap: 10 (ref 5–15)
BUN: 9 mg/dL (ref 6–20)
CO2: 23 mmol/L (ref 22–32)
Calcium: 8.6 mg/dL — ABNORMAL LOW (ref 8.9–10.3)
Chloride: 102 mmol/L (ref 98–111)
Creatinine, Ser: 0.63 mg/dL (ref 0.44–1.00)
GFR calc Af Amer: >60 mL/min (ref >60)
GFR calc non Af Amer: >60 mL/min (ref >60)
Glucose, Bld: 92 mg/dL (ref 70–99)
Potassium: 3.4 mmol/L — ABNORMAL LOW (ref 3.5–5.1)
Sodium: 135 mmol/L (ref 135–145)
Total Bilirubin: 0.6 mg/dL (ref 0.3–1.2)
Total Protein: 8 g/dL (ref 6.5–8.1)

## 2020-08-19 LAB — RESP PANEL BY RT PCR (RSV, FLU A&B, COVID)
Influenza A by PCR: NEGATIVE
Influenza B by PCR: NEGATIVE
Respiratory Syncytial Virus by PCR: NEGATIVE
SARS Coronavirus 2 by RT PCR: POSITIVE — AB

## 2020-08-19 MED ORDER — KETOROLAC TROMETHAMINE 30 MG/ML IJ SOLN
15.0000 mg | Freq: Once | INTRAMUSCULAR | Status: AC
  Administered 2020-08-19: 12:00:00 15 mg via INTRAVENOUS
  Filled 2020-08-19: qty 1

## 2020-08-19 MED ORDER — SODIUM CHLORIDE 0.9 % IV BOLUS
1000.0000 mL | Freq: Once | INTRAVENOUS | Status: AC
  Administered 2020-08-19: 12:00:00 1000 mL via INTRAVENOUS

## 2020-08-19 NOTE — ED Triage Notes (Signed)
Patient c/o fever, headache, and body aches x 3 days. 

## 2020-08-19 NOTE — Discharge Instructions (Addendum)
You have been referred to our clinic for ongoing care of coronavirus.  You should be contacted in the next few days to arrange therapy.  Return here for concerning changes in your condition.

## 2020-08-19 NOTE — ED Notes (Signed)
Pt ambulatory from triage.  Accompanied by brother, who is to translate for pt due to unavailability of appropriate translator.

## 2020-08-19 NOTE — ED Provider Notes (Signed)
[facility]   CSN: [id number] Arrival date & time: 08/19/20  0844     History Chief Complaint  Patient presents with   Fever   bodyaches   Headache    Cindy [name] is a 44 y.o. female.  HPI  Patient presents with her brother provides translation services, per her request. Patient is from [place], understands some English only. She presents today with 3 days of myalgia, cough, nausea, headache. She was well prior to this, has no medical problems. She received her first Covid vaccine dose yesterday. Since onset 3 days ago, she has had no relief with Tylenol.   Past Medical History:  Diagnosis Date   Vaginal Pap smear, abnormal 2017   LGSIL w/ + HPV    Patient Active Problem List   Diagnosis Date Noted   Pap smear abnormality of cervix with LGSIL 06/05/2016    Past Surgical History:  Procedure Laterality Date   NO PAST SURGERIES       OB History    Gravida  4   Para  4   Term  4   Preterm      AB      Living  4     SAB      TAB      Ectopic      Multiple  0   Live Births  1           History reviewed. No pertinent family history.  Social History   Tobacco Use   Smoking status: Never Smoker   Smokeless tobacco: Never Used  Vaping Use   Vaping Use: Never used  Substance Use Topics   Alcohol use: No    Alcohol/week: 0.0 standard drinks   Drug use: No    Home Medications Prior to Admission medications   Medication Sig Start Date End Date Taking? Authorizing Provider  acetaminophen (TYLENOL) 500 MG tablet Take 1 tablet (500 mg total) by mouth every 6 (six) hours as needed. 05/08/18   Fawze, Mina A, PA-C  HYDROcodone-acetaminophen (NORCO/VICODIN) 5-325 MG tablet Take 2 tablets by mouth every 4 (four) hours as needed. Patient not taking: Reported on 05/08/2018 03/11/17   Rolland Porter, MD  ibuprofen (ADVIL,MOTRIN) 600 MG tablet Take 1 tablet (600 mg total) by mouth every 6 (six)  hours as needed. 05/08/18   Fawze, Mina A, PA-C  Prenatal Multivit-Min-Fe-FA (PRENATAL VITAMINS) 0.8 MG tablet Take 1 tablet by mouth daily. Patient not taking: Reported on 11/24/2016 02/07/16   Morrell Riddle, PA-C  ranitidine (ZANTAC) 150 MG tablet Take 1 tablet (150 mg total) by mouth 2 (two) times daily. 05/08/18   Michela Pitcher A, PA-C    Allergies    Patient has no known allergies.  Review of Systems   Review of Systems  Constitutional:       Per HPI, otherwise negative  HENT:       Per HPI, otherwise negative  Respiratory:       Per HPI, otherwise negative  Cardiovascular:       Per HPI, otherwise negative  Gastrointestinal: Negative for vomiting.  Endocrine:       Negative aside from HPI  Genitourinary:       Neg aside from HPI   Musculoskeletal:       Per HPI, otherwise negative  Skin: Negative.   Neurological: Positive for headaches. Negative for syncope.    Physical Exam Updated Vital Signs BP 133/90    Pulse 65  Temp 98.1 F (36.7 C) (Oral)    Resp 15    Ht 6\' 2"  (1.88 m)    Wt 103.4 kg    SpO2 98%    BMI 29.27 kg/m   Physical Exam Vitals and nursing note reviewed.  Constitutional:      General: She is not in acute distress.    Appearance: She is well-developed.  HENT:     Head: Normocephalic and atraumatic.  Eyes:     Conjunctiva/sclera: Conjunctivae normal.  Cardiovascular:     Rate and Rhythm: Normal rate and regular rhythm.  Pulmonary:     Effort: Pulmonary effort is normal. No respiratory distress.     Breath sounds: Normal breath sounds. No stridor.  Abdominal:     General: There is no distension.  Skin:    General: Skin is warm and dry.  Neurological:     Mental Status: She is alert and oriented to person, place, and time.     Cranial Nerves: No cranial nerve deficit.     ED Results / Procedures / Treatments   Labs (all labs ordered are listed, but only abnormal results are displayed) Labs Reviewed  RESP PANEL BY RT PCR (RSV, FLU A&B,  COVID) - Abnormal; Notable for the following components:      Result Value   SARS Coronavirus 2 by RT PCR POSITIVE (*)    All other components within normal limits  COMPREHENSIVE METABOLIC PANEL - Abnormal; Notable for the following components:   Potassium 3.4 (*)    Calcium 8.6 (*)    All other components within normal limits  CBC WITH DIFFERENTIAL/PLATELET - Abnormal; Notable for the following components:   WBC 3.7 (*)    RBC 5.27 (*)    All other components within normal limits    EKG None  Radiology DG Chest Port 1 View  Result Date: 08/19/2020 CLINICAL DATA:  COVID like illness EXAM: PORTABLE CHEST 1 VIEW COMPARISON:  2019 FINDINGS: Low lung volumes. No new consolidation or edema. No pleural effusion. Similar cardiomediastinal contours with normal heart size. IMPRESSION: No acute process in the chest. Electronically Signed   By: 2020 M.D.   On: 08/19/2020 12:32    Procedures Procedures (including critical care time)  Medications Ordered in ED Medications  sodium chloride 0.9 % bolus 1,000 mL (0 mLs Intravenous Stopped 08/19/20 1326)  ketorolac (TORADOL) 30 MG/ML injection 15 mg (15 mg Intravenous Given 08/19/20 1210)    ED Course  I have reviewed the triage vital signs and the nursing notes.  Pertinent labs & imaging results that were available during my care of the patient were reviewed by me and considered in my medical decision making (see chart for details).  Patient remains hemodynamically unremarkable. Labs reassuring, consistent with Covid infection, which is demonstrated on later lab results. Absent hemodynamic instability, evidence for new oxygen requirement or decompensation, patient discharged in stable condition with Covid infection. Final Clinical Impression(s) / ED Diagnoses Final diagnoses:  COVID     10/19/20, MD 08/19/20 1722

## 2020-08-19 NOTE — ED Notes (Signed)
CRITICAL VALUE STICKER  CRITICAL VALUE: COVID Positive  RECEIVER (on-site recipient of call): CSimpson, RN   DATE & TIME NOTIFIED: 08/19/20 1315  MESSENGER (representative from lab): LBillingsly  MD NOTIFIED: Jeraldine Loots  TIME OF NOTIFICATION:See orders  RESPONSE:

## 2020-08-20 ENCOUNTER — Telehealth: Payer: Self-pay | Admitting: Nurse Practitioner

## 2020-08-20 NOTE — Telephone Encounter (Signed)
Called patient to discuss Covid symptoms and the use of casirivimab/imdevimab, a monoclonal antibody infusion for those with mild to moderate Covid symptoms and at a high risk of hospitalization.  Pt is qualified for this infusion at the [facility] center due to; Specific high risk criteria : BMI > 25.  Message left to call back our hotline [phone].  Cindy Heiberger, NP   

## 2020-08-21 ENCOUNTER — Emergency Department (HOSPITAL_COMMUNITY)
Admission: EM | Admit: 2020-08-21 | Discharge: 2020-08-21 | Disposition: A | Discharge status: Home / Self Care | Payer: Medicaid Other | Attending: Emergency Medicine | Admitting: Emergency Medicine

## 2020-08-21 ENCOUNTER — Emergency Department (HOSPITAL_COMMUNITY)
Admit: 2020-08-21 | Discharge: 2020-08-21 | Disposition: A | Discharge status: Home / Self Care | Payer: Medicaid Other | Attending: Pulmonary Disease | Admitting: Pulmonary Disease

## 2020-08-21 ENCOUNTER — Encounter (HOSPITAL_COMMUNITY): Payer: Self-pay

## 2020-08-21 ENCOUNTER — Emergency Department (HOSPITAL_COMMUNITY): Payer: Medicaid Other

## 2020-08-21 ENCOUNTER — Other Ambulatory Visit: Payer: Self-pay | Admitting: Family

## 2020-08-21 DIAGNOSIS — Z609 Problem related to social environment, unspecified: Secondary | ICD-10-CM

## 2020-08-21 DIAGNOSIS — U071 COVID-19: Secondary | ICD-10-CM | POA: Insufficient documentation

## 2020-08-21 DIAGNOSIS — R0602 Shortness of breath: Secondary | ICD-10-CM | POA: Diagnosis present

## 2020-08-21 DIAGNOSIS — Z6829 Body mass index (BMI) 29.0-29.9, adult: Secondary | ICD-10-CM

## 2020-08-21 DIAGNOSIS — J1282 Pneumonia due to coronavirus disease 2019: Secondary | ICD-10-CM | POA: Insufficient documentation

## 2020-08-21 MED ORDER — FAMOTIDINE IN NACL 20-0.9 MG/50ML-% IV SOLN: 20.0000 mg | Freq: Once | INTRAVENOUS | Status: DC | PRN

## 2020-08-21 MED ORDER — ALBUTEROL SULFATE HFA 108 (90 BASE) MCG/ACT IN AERS: 2.0000 | INHALATION_SPRAY | Freq: Once | RESPIRATORY_TRACT | Status: DC | PRN

## 2020-08-21 MED ORDER — METHYLPREDNISOLONE SODIUM SUCC 125 MG IJ SOLR: 125.0000 mg | Freq: Once | INTRAMUSCULAR | Status: DC | PRN

## 2020-08-21 MED ORDER — SODIUM CHLORIDE 0.9 % IV SOLN
1200.0000 mg | Freq: Once | INTRAVENOUS | Status: AC
  Administered 2020-08-21: 1200 mg via INTRAVENOUS

## 2020-08-21 MED ORDER — EPINEPHRINE 0.3 MG/0.3ML IJ SOAJ: 0.3000 mg | Freq: Once | INTRAMUSCULAR | Status: DC | PRN

## 2020-08-21 MED ORDER — SODIUM CHLORIDE 0.9 % IV SOLN: INTRAVENOUS | Status: DC | PRN

## 2020-08-21 MED ORDER — KETOROLAC TROMETHAMINE 15 MG/ML IJ SOLN
15.0000 mg | Freq: Once | INTRAMUSCULAR | Status: AC
  Administered 2020-08-21: 15 mg via INTRAVENOUS
  Filled 2020-08-21: qty 1

## 2020-08-21 MED ORDER — DIPHENHYDRAMINE HCL 50 MG/ML IJ SOLN: 50.0000 mg | Freq: Once | INTRAMUSCULAR | Status: DC | PRN

## 2020-08-21 NOTE — Progress Notes (Signed)
Spoke with patient about Covid symptoms and the use of the monoclonal antibody infusion for those with mild to moderate Covid symptoms and at a high risk of hospitalization.     Pt appears to qualify for this infusion due to co-morbid conditions and/or a member of an at-risk group in accordance with the FDA Emergency Use Authorization.    Ms. Cindy [name] has been seen in the emergency department on multiple days most recently today presenting for generalized body aches, shortness of breath, and diarrhea.  Primary language is Zarma and her family was used to communicate through the phone.  Symptom onset was 08/16/2020.  Continues to have cough, congestion, and chest pain.  Qualifying risk factors include BMI greater than 25 and social vulnerability risk.  I spoke with Ms. Cindy [name] regarding the risks and the benefits of treatment with Regeneron and she wishes to continue with treatment at this time.  Marcos Eke, NP 08/21/2020 12:12 PM

## 2020-08-21 NOTE — ED Notes (Signed)
X-ray at bedside

## 2020-08-21 NOTE — Discharge Instructions (Addendum)
You have Covid, which is a viral illness.  This should be treated symptomatically. Use Tylenol ibuprofen and cough drops as needed for symptom control. Make sure you stay well-hydrated water. You will likely continue to feel ill for several days to weeks. Return to the emergency room if you develop severe worsening chest pain, difficulty breathing, any new, worsening, or concerning symptoms.

## 2020-08-21 NOTE — ED Notes (Signed)
I ambulating patient around the room the lowest her stats drop were 97%

## 2020-08-21 NOTE — Progress Notes (Signed)
°  Diagnosis: COVID-19 ° °Physician:Dr Wright  ° °Procedure: Covid Infusion Clinic Med: casirivimab\imdevimab infusion - Provided patient with casirivimab\imdevimab fact sheet for patients, parents and caregivers prior to infusion. ° °Complications: No immediate complications noted. ° °Discharge: Discharged home  ° °Nagee Goates W °08/21/2020 ° °

## 2020-08-21 NOTE — ED Provider Notes (Signed)
[facility]-EMERGENCY DEPT Provider Note   CSN: [id number] Arrival date & time: 08/21/20  0093     History Chief Complaint  Patient presents with  . Covid Positive  . Shortness of Breath  . Emesis  . Diarrhea    Cindy [name] is a 44 y.o. female presenting for evaluation of generalized body aches, shortness of breath, diarrhea.  Level 5 caveat due to language barrier.  Patient speaks Zarma. History obtained with assistance of pt's brother, Elenora Gamma, via speaker phone.   Pt states she is not feeling well. Pt reports generalized body aches, worse in her back. She reports difficulty breathing, worse today. She has associated diarrhea. Per brother, pt is taking tylenol.   Additional history obtained from chart review. Reviewed ed chart from 2 days ago, including labs and cxr. Per chart review, pt qualifies for MAB infusion, clinic has reached out to pt without success.   HPI     Past Medical History:  Diagnosis Date  . 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 NO PAST SURGERIES       OB History    Gravida  4   Para  4   Term  4   Preterm      AB      Living  4     SAB      TAB      Ectopic      Multiple  0   Live Births  1           No family history on file.  Social History   Tobacco Use  . Smoking status: Never Smoker  . Smokeless tobacco: Never Used  Vaping Use  . Vaping Use: Never used  Substance Use Topics  . Alcohol use: No    Alcohol/week: 0.0 standard drinks  . Drug use: No    Home Medications Prior to Admission medications   Medication Sig Start Date End Date Taking? Authorizing Provider  acetaminophen (TYLENOL) 500 MG tablet Take 1 tablet (500 mg total) by mouth every 6 (six) hours as needed. 05/08/18  Yes Fawze, Mina A, PA-C    ibuprofen (ADVIL,MOTRIN) 600 MG tablet Take 1 tablet (600 mg total) by mouth every 6 (six) hours as needed. 05/08/18  Yes Fawze, Mina A, PA-C  HYDROcodone-acetaminophen (NORCO/VICODIN) 5-325 MG tablet Take 2 tablets by mouth every 4 (four) hours as needed. Patient not taking: Reported on 05/08/2018 03/11/17   Rolland Porter, MD  Prenatal Multivit-Min-Fe-FA (PRENATAL VITAMINS) 0.8 MG tablet Take 1 tablet by mouth daily. Patient not taking: Reported on 11/24/2016 02/07/16   Morrell Riddle, PA-C  ranitidine (ZANTAC) 150 MG tablet Take 1 tablet (150 mg total) by mouth 2 (two) times daily. Patient not taking: Reported on 08/21/2020 05/08/18   Michela Pitcher A, PA-C    Allergies    Patient has no known allergies.  Review of Systems   Review of Systems  Unable to perform ROS: Other   Language barrier  Physical Exam Updated Vital Signs BP (!) 135/98   Pulse 88   Temp 98.1 F (36.7 C) (Oral)   Resp (!) 9   Wt 103.4 kg   SpO2 98%   BMI 29.27 kg/m   Physical Exam Vitals and nursing note reviewed.  Constitutional:      General: She is not in acute distress.    Appearance: She is well-developed.     Comments: Appears nontoxic  HENT:     Head: Normocephalic and atraumatic.  Eyes:     Extraocular Movements: Extraocular movements intact.     Conjunctiva/sclera: Conjunctivae normal.     Pupils: Pupils are equal, round, and reactive to light.  Cardiovascular:     Rate and Rhythm: Normal rate and regular rhythm.     Pulses: Normal pulses.  Pulmonary:     Effort: Pulmonary effort is normal. No respiratory distress.     Breath sounds: Normal breath sounds. No wheezing.     Comments: Speaking in full sentences. mildly tachypneic. spo2 stable on RA.  Abdominal:     General: There is no distension.     Palpations: Abdomen is soft. There is no mass.     Tenderness: There is no abdominal tenderness. There is no guarding or rebound.  Musculoskeletal:        General: Normal range of motion.      Cervical back: Normal range of motion and neck supple.  Skin:    General: Skin is warm and dry.     Capillary Refill: Capillary refill takes less than 2 seconds.  Neurological:     Mental Status: She is alert and oriented to person, place, and time.     ED Results / Procedures / Treatments   Labs (all labs ordered are listed, but only abnormal results are displayed) Labs Reviewed - No data to display  EKG EKG Interpretation  Date/Time:  Wednesday August 21 2020 10:08:43 EDT Ventricular Rate:  88 PR Interval:    QRS Duration: 94 QT Interval:  357 QTC Calculation: 432 R Axis:   48 Text Interpretation: Sinus rhythm Interpretation limited secondary to artifact otherwise no acute ST/T changes Confirmed by Pricilla Loveless [phone]) on 08/21/2020 11:02:06 AM   Radiology DG Chest Portable 1 View  Result Date: 08/21/2020 CLINICAL DATA:  Shortness of breath, weakness EXAM: PORTABLE CHEST 1 VIEW COMPARISON:  08/19/2020 FINDINGS: Hazy right lower lobe airspace disease concerning for pneumonia. No pleural effusion or pneumothorax. Heart and mediastinal contours are unremarkable. No acute osseous abnormality. IMPRESSION: Hazy right lower lobe airspace disease concerning for pneumonia. Electronically Signed   By: Elige Ko   On: 08/21/2020 10:35   DG Chest Port 1 View  Result Date: 08/19/2020 CLINICAL DATA:  COVID like illness EXAM: PORTABLE CHEST 1 VIEW COMPARISON:  2019 FINDINGS: Low lung volumes. No new consolidation or edema. No pleural effusion. Similar cardiomediastinal contours with normal heart size. IMPRESSION: No acute process in the chest. Electronically Signed   By: Guadlupe Spanish M.D.   On: 08/19/2020 12:32    Procedures Procedures (including critical care time)  Medications Ordered in ED Medications  ketorolac (TORADOL) 15 MG/ML injection 15 mg (15 mg Intravenous Given 08/21/20 1129)    ED Course  I have reviewed the triage vital signs and the nursing notes.  Pertinent  labs & imaging results that were available during my care of the patient were reviewed by me and considered in my medical decision making (see chart for details).    MDM Rules/Calculators/A&P                          Pt presenting for evaluation of covid sxs. On exam, pt appears nontoxic. She is mildly tachypneic, but not hypoxic. No respiratory distress. Due to  pt's BMI, she qualifies for MAB. Will reach out to infusion clinic to try to arrange infusion today while pt is at the hospital. cxr and ambulatory sats ordered. Will give toradol for myalgias. I do not believe pt needs repeat labs, as they were reassuring 2 days ago.   cxr viewed and interpreted by me, shows hazy opacities in the RLL, c/w covid pna. Pt's ambulatory spo2 reassuring. discussed with MAB infusion clinic, pt can be seen in clinic after cleared by the ED. discussed findings with patient and brother.  Discussed with patient that this time, does not appear she needs to be admitted as her pulmonary exam is overall reassuring.  Encourage continued symptomatic treatment.  Discussed treatment of mag infusion after ER visit, patient mother agreeable.  I discussed patient will be discharged from the ED and will go home after her infusion.  At this time, patient appears safe for discharge.  Return precautions given.  Patient states she understands and agrees to plan.  Lodie Waheed was evaluated in Emergency Department on 08/21/2020 for the symptoms described in the history of present illness. She was evaluated in the context of the global COVID-19 pandemic, which necessitated consideration that the patient might be at risk for infection with the SARS-CoV-2 virus that causes COVID-19. Institutional protocols and algorithms that pertain to the evaluation of patients at risk for COVID-19 are in a state of rapid change based on information released by regulatory bodies including the CDC and federal and state organizations. These policies and  algorithms were followed during the patient's care in the ED.   Final Clinical Impression(s) / ED Diagnoses Final diagnoses:  COVID-19  Pneumonia due to COVID-19 virus    Rx / DC Orders ED Discharge Orders    None       Alveria Apley, PA-C 08/21/20 1141    Lorre Nick, MD 08/26/20 1354

## 2020-08-21 NOTE — ED Triage Notes (Addendum)
Per EMS, Pt, from home, c/o increasing SOB, body aches, nausea, and diarrhea.  Pt was diagnosed w/ COVID x 2 days ago.  Complaints were provided by translator at her home. Pt speaks Zarma.    500L NS in route.

## 2020-08-21 NOTE — Progress Notes (Signed)
I connected by phone with Cindy [name] on 08/21/2020 at 12:07 PM to discuss the potential use of a new treatment for mild to moderate COVID-19 viral infection in non-hospitalized patients.  This patient is a 44 y.o. female that meets the FDA criteria for Emergency Use Authorization of COVID monoclonal antibody casirivimab/imdevimab or bamlanivimab/eteseviamb.  Has a (+) direct SARS-CoV-2 viral test result  Has mild or moderate COVID-19   Is NOT hospitalized due to COVID-19  Is within 10 days of symptom onset  Has at least one of the high risk factor(s) for progression to severe COVID-19 and/or hospitalization as defined in EUA.  Specific high risk criteria : BMI > 25 and Other high risk medical condition per CDC:  high social risk   I have spoken and communicated the following to the patient or parent/caregiver regarding COVID monoclonal antibody treatment:  1. FDA has authorized the emergency use for the treatment of mild to moderate COVID-19 in adults and pediatric patients with positive results of direct SARS-CoV-2 viral testing who are 34 years of age and older weighing at least 40 kg, and who are at high risk for progressing to severe COVID-19 and/or hospitalization.  2. The significant known and potential risks and benefits of COVID monoclonal antibody, and the extent to which such potential risks and benefits are unknown.  3. Information on available alternative treatments and the risks and benefits of those alternatives, including clinical trials.  4. Patients treated with COVID monoclonal antibody should continue to self-isolate and use infection control measures (e.g., wear mask, isolate, social distance, avoid sharing personal items, clean and disinfect "high touch" surfaces, and frequent handwashing) according to CDC guidelines.   5. The patient or parent/caregiver has the option to accept or refuse COVID monoclonal antibody treatment.  After reviewing this information with  the patient, the patient has agreed to receive one of the available covid 19 monoclonal antibodies and will be provided an appropriate fact sheet prior to infusion.   Cindy Luz, FNP 08/21/2020 12:07 PM

## 2020-08-21 NOTE — Discharge Instructions (Signed)

## 2020-08-22 NOTE — Telephone Encounter (Signed)
Done

## 2021-09-01 ENCOUNTER — Encounter (HOSPITAL_COMMUNITY): Payer: Self-pay

## 2021-09-01 ENCOUNTER — Other Ambulatory Visit: Payer: Self-pay

## 2021-09-01 ENCOUNTER — Emergency Department (HOSPITAL_COMMUNITY)
Admission: EM | Admit: 2021-09-01 | Discharge: 2021-09-01 | Disposition: A | Discharge status: Home / Self Care | Payer: Medicaid Other | Attending: Emergency Medicine | Admitting: Emergency Medicine

## 2021-09-01 DIAGNOSIS — M5441 Lumbago with sciatica, right side: Secondary | ICD-10-CM | POA: Diagnosis not present

## 2021-09-01 MED ORDER — CYCLOBENZAPRINE HCL 10 MG PO TABS: 10.0000 mg | ORAL_TABLET | Freq: Two times a day (BID) | ORAL | 0 refills | Status: DC | PRN

## 2021-09-01 MED ORDER — METHYLPREDNISOLONE 4 MG PO TBPK: ORAL_TABLET | ORAL | 0 refills | Status: AC

## 2021-09-01 NOTE — ED Provider Notes (Signed)
[facility]-EMERGENCY DEPT Provider Note   CSN: [id number] Arrival date & time: 09/01/21  0827     History Chief Complaint  Patient presents with   Back Pain    Cindy [name] is a 45 y.o. female.  The history is provided by the patient.  Back Pain Location:  Lumbar spine Quality:  Aching Radiates to:  R thigh and R foot Pain severity:  Mild Onset quality:  Gradual Duration:  24 months Timing:  Intermittent Progression:  Waxing and waning Chronicity:  Recurrent Context: not falling and not recent illness   Relieved by:  Nothing Worsened by:  Movement and ambulation Associated symptoms: tingling   Associated symptoms: no abdominal pain, no abdominal swelling, no bladder incontinence, no bowel incontinence, no chest pain, no dysuria, no fever, no headaches, no leg pain, no numbness, no paresthesias, no pelvic pain, no perianal numbness, no weakness and no weight loss       Past Medical History:  Diagnosis Date   Vaginal Pap smear, abnormal 2017   LGSIL w/ + HPV    Patient Active Problem List   Diagnosis Date Noted   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 No pertinent family history.  Social History   Tobacco Use   Smoking status: Never   Smokeless tobacco: Never  Vaping Use   Vaping Use: Never used  Substance Use Topics   Alcohol use: No    Alcohol/week: 0.0 standard drinks   Drug use: No    Home Medications Prior to Admission medications   Medication Sig Start Date End Date Taking? Authorizing Provider  cyclobenzaprine (FLEXERIL) 10 MG tablet Take 1 tablet (10 mg total) by mouth 2 (two) times daily as needed for muscle spasms. 09/01/21   Yes Cranford Blessinger, DO  methylPREDNISolone (MEDROL DOSEPAK) 4 MG TBPK tablet Follow package insert 09/01/21  Yes Janis Sol, DO  acetaminophen (TYLENOL) 500 MG tablet Take 1 tablet (500 mg total) by mouth every 6 (six) hours as needed. 05/08/18   Fawze, Mina A, PA-C  HYDROcodone-acetaminophen (NORCO/VICODIN) 5-325 MG tablet Take 2 tablets by mouth every 4 (four) hours as needed. Patient not taking: Reported on 05/08/2018 03/11/17   Rolland Porter, MD  ibuprofen (ADVIL,MOTRIN) 600 MG tablet Take 1 tablet (600 mg total) by mouth every 6 (six) hours as needed. 05/08/18   Fawze, Mina A, PA-C  Prenatal Multivit-Min-Fe-FA (PRENATAL VITAMINS) 0.8 MG tablet Take 1 tablet by mouth daily. Patient not taking: Reported on 11/24/2016 02/07/16   Morrell Riddle, PA-C  ranitidine (ZANTAC) 150 MG tablet Take 1 tablet (150 mg total) by mouth 2 (two) times daily. Patient not taking: Reported on 08/21/2020 05/08/18   Michela Pitcher A, PA-C    Allergies    Patient has no known allergies.  Review of Systems   Review of Systems  Constitutional:  Negative for chills, fever and weight loss.  HENT:  Negative  for ear pain and sore throat.   Eyes:  Negative for pain and visual disturbance.  Respiratory:  Negative for cough and shortness of breath.   Cardiovascular:  Negative for chest pain and palpitations.  Gastrointestinal:  Negative for abdominal pain, bowel incontinence and vomiting.  Genitourinary:  Negative for bladder incontinence, dysuria, hematuria and pelvic pain.  Musculoskeletal:  Positive for back pain and gait problem. Negative for arthralgias, joint swelling, myalgias, neck pain and neck stiffness.  Skin:  Negative for color change and rash.  Neurological:  Positive for tingling. Negative for seizures, syncope, weakness, numbness, headaches and paresthesias.  All other systems reviewed and are negative.  Physical Exam Updated Vital Signs BP 129/80 (BP Location: Left Arm)   Pulse 71   Temp (!) 97.3 F (36.3  C) (Oral)   Resp 18   Ht 5\' 6"  (1.676 m)   Wt 99.8 kg   SpO2 100%   BMI 35.51 kg/m   Physical Exam Vitals and nursing note reviewed.  Constitutional:      General: She is not in acute distress.    Appearance: She is well-developed.  HENT:     Head: Normocephalic and atraumatic.  Eyes:     Extraocular Movements: Extraocular movements intact.     Conjunctiva/sclera: Conjunctivae normal.     Pupils: Pupils are equal, round, and reactive to light.  Cardiovascular:     Rate and Rhythm: Normal rate and regular rhythm.     Pulses: Normal pulses.     Heart sounds: Normal heart sounds. No murmur heard. Pulmonary:     Effort: Pulmonary effort is normal. No respiratory distress.     Breath sounds: Normal breath sounds.  Abdominal:     Palpations: Abdomen is soft.     Tenderness: There is no abdominal tenderness.  Musculoskeletal:        General: Tenderness present.     Cervical back: Neck supple.     Comments: Tenderness to paraspinal lumbar muscles on the right and right gluteal area  Skin:    General: Skin is warm and dry.  Neurological:     General: No focal deficit present.     Mental Status: She is alert and oriented to person, place, and time.     Cranial Nerves: No cranial nerve deficit.     Sensory: No sensory deficit.     Motor: No weakness.     Comments: 5+ out of 5 strength throughout, normal sensation, no drift, normal speech    ED Results / Procedures / Treatments   Labs (all labs ordered are listed, but only abnormal results are displayed) Labs Reviewed - No data to display  EKG None  Radiology No results found.  Procedures Procedures   Medications Ordered in ED Medications - No data to display  ED Course  I have reviewed the triage vital signs and the nursing notes.  Pertinent labs & imaging results that were available during my care of the patient were reviewed by me and considered in my medical decision making (see chart for details).    MDM  Rules/Calculators/A&P                           Cindy [name] is here with back pain and leg pain.  Has been having low back pain with shooting pain into her feet for the last several years.  Does not have a primary care doctor.  Unremarkable vitals.  No fever.  No  cauda equina symptoms.  Pain mostly in the right lower back radiating into the right foot.  Sometimes pain is in the left lower leg but that has been doing well.  Overall patient with mostly right-sided sciatic pain today.  Neurovascular neuromuscularly intact.  No concern for DVT or peripheral arterial process.  No concern for cauda equina or other spinal process.  She denies pregnancy.  No abdominal pain, nausea, vomiting.  No kidney stone symptoms.  No UTI symptoms.  Will prescribe Medrol Dosepak and Flexeril.  Discharged in good condition.  Understands return precautions.  This chart was dictated using voice recognition software.  Despite best efforts to proofread,  errors can occur which can change the documentation meaning.   Final Clinical Impression(s) / ED Diagnoses Final diagnoses:  Acute right-sided low back pain with right-sided sciatica    Rx / DC Orders ED Discharge Orders          Ordered    methylPREDNISolone (MEDROL DOSEPAK) 4 MG TBPK tablet        09/01/21 0924    cyclobenzaprine (FLEXERIL) 10 MG tablet  2 times daily PRN        09/01/21 0924             Virgina Norfolk, DO 09/01/21 [id number]

## 2021-09-01 NOTE — ED Triage Notes (Signed)
"  Bilateral foot pain and swelling x 2 years, right foot worse than right and think there may be something inside of it" per friend

## 2021-09-10 NOTE — Progress Notes (Signed)
Cindy [name], is a 45 y.o. female  AJO:[id number]  NOB:[id number]  DOB - 17-Dec-1975  Chief Complaint  Patient presents with   Back Pain       Subjective:   Cindy [name] is a 45 y.o. female here today for a follow up visit and to establish care. She has had this back pain for more than 2 years.  She has had swelling in the R leg with shooting pains running down her leg for 2 years.  It is recently becoming worse. No urinating s/sx  Also c/o L foot pain for about 1 year.  Pain is in the plantar region.  Hurts all the time-not just with first morning pressure.    Also wants HIV, diabetes, and Hep screening.    They refused interpreters and her sister is translating  After ED visit 09/01/2021 for back pain and treated with prednisone and flexeril.  From ED note: Cindy [name] is here with back pain and leg pain.  Has been having low back pain with shooting pain into her feet for the last several years.  Does not have a primary care doctor.  Unremarkable vitals.  No fever.  No cauda equina symptoms.  Pain mostly in the right lower back radiating into the right foot.  Sometimes pain is in the left lower leg but that has been doing well.  Overall patient with mostly right-sided sciatic pain today.  Neurovascular neuromuscularly intact.  No concern for DVT or peripheral arterial process.  No concern for cauda equina or other spinal process.  She denies pregnancy.  No abdominal pain, nausea, vomiting.  No kidney stone symptoms.  No UTI symptoms.  Will prescribe Medrol Dosepak and Flexeril.  Discharged in good condition.  Understands return precautions.   Patient has No headache, No chest pain, No abdominal pain - No Nausea, No new weakness tingling or numbness, No Cough - SOB.  No problems updated.  ALLERGIES: No Known Allergies  PAST MEDICAL HISTORY: Past Medical History:  Diagnosis Date   Vaginal Pap smear, abnormal 2017   LGSIL w/ + HPV    MEDICATIONS AT HOME: Prior to  Admission medications   Medication Sig Start Date End Date Taking? Authorizing Provider  acetaminophen (TYLENOL) 500 MG tablet Take 1 tablet (500 mg total) by mouth every 6 (six) hours as needed. 05/08/18  Yes Fawze, Mina A, PA-C  cyclobenzaprine (FLEXERIL) 10 MG tablet 1/2-1 tab 3 times daily as needed for muscle spasms 09/11/21   Anders Simmonds, PA-C  HYDROcodone-acetaminophen (NORCO/VICODIN) 5-325 MG tablet Take 2 tablets by mouth every 4 (four) hours as needed. Patient not taking: No sig reported 03/11/17   Rolland Porter, MD  ibuprofen (ADVIL) 600 MG tablet Take 1 tablet (600 mg total) by mouth every 6 (six) hours as needed. 09/11/21   Anders Simmonds, PA-C  methylPREDNISolone (MEDROL DOSEPAK) 4 MG TBPK tablet Follow package insert Patient not taking: Reported on 09/11/2021 09/01/21   Virgina Norfolk, DO  Prenatal Multivit-Min-Fe-FA (PRENATAL VITAMINS) 0.8 MG tablet Take 1 tablet by mouth daily. Patient not taking: No sig reported 02/07/16   Valarie Cones, Dema Severin, PA-C  ranitidine (ZANTAC) 150 MG tablet Take 1 tablet (150 mg total) by mouth 2 (two) times daily. Patient not taking: No sig reported 05/08/18   Michela Pitcher A, PA-C    ROS: Neg HEENT Neg resp Neg cardiac Neg GI Neg GU Neg MS Neg psych Neg neuro  Objective:   Vitals:   09/11/21 0858  BP: 129/84  Pulse: 64  SpO2: 100%  Weight: 233 lb 4 oz (105.8 kg)  Height: 5' 9.5" (1.765 m)   Exam General appearance : Awake, alert, not in any distress. Speech Clear. Not toxic looking HEENT: Atraumatic and Normocephalic Neck: Supple, no JVD. No cervical lymphadenopathy.  Chest: Good air entry bilaterally, CTAB.  No rales/rhonchi/wheezing CVS: S1 S2 regular, no murmurs.  Back-ROM 80% of normal, neg SLR and DTR=intact B.  Her R leg is slightly larger with mild edema vs L.  (She says this is long-standing>71yrs).  Neg homan's.  No erythema of calf.  L foot and ankle with no gross abnormality.  TTP lateral L foot and plantar area  both legs  are warm to touch Neurology: Awake alert, and oriented X 3, CN II-XII intact, Non focal Skin: No Rash  Data Review No results found for: HGBA1C  Assessment & Plan   1. Chronic right-sided low back pain with right-sided sciatica No red flags - cyclobenzaprine (FLEXERIL) 10 MG tablet; 1/2-1 tab 3 times daily as needed for muscle spasms  Dispense: 60 tablet; Refill: 0 - ibuprofen (ADVIL) 600 MG tablet; Take 1 tablet (600 mg total) by mouth every 6 (six) hours as needed.  Dispense: 60 tablet; Refill: 0 - Comprehensive metabolic panel  2. Left foot pain Plantar fascitis vs spur, other.  I will have her see ortho for the foot and the back - cyclobenzaprine (FLEXERIL) 10 MG tablet; 1/2-1 tab 3 times daily as needed for muscle spasms  Dispense: 60 tablet; Refill: 0 - ibuprofen (ADVIL) 600 MG tablet; Take 1 tablet (600 mg total) by mouth every 6 (six) hours as needed.  Dispense: 60 tablet; Refill: 0 - Comprehensive metabolic panel  3. Need for hepatitis C screening test - HepB+HepC+HIV Panel  4. Need for hepatitis B screening test - HepB+HepC+HIV Panel  5. Screening for diabetes mellitus I have had a lengthy discussion and provided education about insulin resistance and the intake of too much sugar/refined carbohydrates.  I have advised the patient to work at a goal of eliminating sugary drinks, candy, desserts, sweets, refined sugars, processed foods, and white carbohydrates.  The patient expresses understanding.   - Hemoglobin A1c  6. Hypokalemia - Comprehensive metabolic panel  Sister interpreted used and additional time performing visit was required.   Patient have been counseled extensively about nutrition and exercise. Other issues discussed during this visit include: low cholesterol diet, weight control and daily exercise, foot care, annual eye examinations at Ophthalmology, importance of adherence with medications and regular follow-up. We also discussed long term complications  of uncontrolled diabetes and hypertension.   Return in about 2 months (around 11/11/2021) for assign PCP.  The patient was given clear instructions to go to ER or return to medical center if symptoms don't improve, worsen or new problems develop. The patient verbalized understanding. The patient was told to call to get lab results if they haven't heard anything in the next week.      Georgian Co, PA-C [facility] and [facility] [place], [place] [phone]   09/11/2021, 9:32 AM

## 2021-09-11 ENCOUNTER — Other Ambulatory Visit: Payer: Self-pay

## 2021-09-11 ENCOUNTER — Encounter: Payer: Self-pay | Admitting: Physician Assistant

## 2021-09-11 ENCOUNTER — Ambulatory Visit: Payer: Medicaid Other | Attending: Physician Assistant | Admitting: Physician Assistant

## 2021-09-11 VITALS — BP 129/84 | HR 64 | Ht 69.5 in | Wt 233.2 lb

## 2021-09-11 DIAGNOSIS — G8929 Other chronic pain: Secondary | ICD-10-CM | POA: Diagnosis not present

## 2021-09-11 DIAGNOSIS — M79672 Pain in left foot: Secondary | ICD-10-CM | POA: Diagnosis not present

## 2021-09-11 DIAGNOSIS — M5441 Lumbago with sciatica, right side: Secondary | ICD-10-CM | POA: Diagnosis not present

## 2021-09-11 DIAGNOSIS — Z131 Encounter for screening for diabetes mellitus: Secondary | ICD-10-CM

## 2021-09-11 DIAGNOSIS — E876 Hypokalemia: Secondary | ICD-10-CM

## 2021-09-11 DIAGNOSIS — Z789 Other specified health status: Secondary | ICD-10-CM

## 2021-09-11 DIAGNOSIS — Z1159 Encounter for screening for other viral diseases: Secondary | ICD-10-CM

## 2021-09-11 DIAGNOSIS — Z23 Encounter for immunization: Secondary | ICD-10-CM | POA: Diagnosis not present

## 2021-09-11 MED ORDER — IBUPROFEN 600 MG PO TABS: 600.0000 mg | ORAL_TABLET | Freq: Four times a day (QID) | ORAL | 0 refills | Status: AC | PRN

## 2021-09-11 MED ORDER — CYCLOBENZAPRINE HCL 10 MG PO TABS: ORAL_TABLET | ORAL | 0 refills | Status: AC

## 2021-09-12 LAB — HEPB+HEPC+HIV PANEL
HIV Screen 4th Generation wRfx: NONREACTIVE
Hep B C IgM: NEGATIVE
Hep B Core Total Ab: POSITIVE — AB
Hep B E Ab: POSITIVE — AB
Hep B E Ag: NEGATIVE
Hep B Surface Ab, Qual: NONREACTIVE
Hep C Virus Ab: <0.1 s/co ratio (ref 0.0–0.9)
Hepatitis B Surface Ag: POSITIVE — AB

## 2021-09-12 LAB — COMPREHENSIVE METABOLIC PANEL
ALT: 9 IU/L (ref 0–32)
AST: 13 IU/L (ref 0–40)
Albumin/Globulin Ratio: 1.3 (ref 1.2–2.2)
Albumin: 4.1 g/dL (ref 3.8–4.8)
Alkaline Phosphatase: 53 IU/L (ref 44–121)
BUN/Creatinine Ratio: 15 (ref 9–23)
BUN: 11 mg/dL (ref 6–24)
Bilirubin Total: 0.3 mg/dL (ref 0.0–1.2)
CO2: 25 mmol/L (ref 20–29)
Calcium: 9.5 mg/dL (ref 8.7–10.2)
Chloride: 103 mmol/L (ref 96–106)
Creatinine, Ser: 0.73 mg/dL (ref 0.57–1.00)
Globulin, Total: 3.2 g/dL (ref 1.5–4.5)
Glucose: 93 mg/dL (ref 70–99)
Potassium: 4.2 mmol/L (ref 3.5–5.2)
Sodium: 140 mmol/L (ref 134–144)
Total Protein: 7.3 g/dL (ref 6.0–8.5)
eGFR: 105 mL/min/{1.73_m2} (ref >59)

## 2021-09-12 LAB — HEMOGLOBIN A1C
Est. average glucose Bld gHb Est-mCnc: 114 mg/dL
Hgb A1c MFr Bld: 5.6 % (ref 4.8–5.6)

## 2021-09-17 ENCOUNTER — Other Ambulatory Visit: Payer: Self-pay | Admitting: Physician Assistant

## 2021-09-17 ENCOUNTER — Telehealth: Payer: Self-pay

## 2021-09-17 DIAGNOSIS — B1910 Unspecified viral hepatitis B without hepatic coma: Secondary | ICD-10-CM

## 2021-09-17 NOTE — Telephone Encounter (Signed)
Pt contacted the office to get lab results provided pt with results and she doesn't have any questions or concerns

## 2021-09-19 ENCOUNTER — Ambulatory Visit: Payer: Medicaid Other | Admitting: Internal Medicine

## 2021-09-19 ENCOUNTER — Telehealth (INDEPENDENT_AMBULATORY_CARE_PROVIDER_SITE_OTHER): Payer: Self-pay

## 2021-09-19 NOTE — Telephone Encounter (Signed)
Copied from CRM [id number]. Topic: General - Other >> Sep 17, 2021  1:32 PM Jaquita Rector A wrote: Reason for CRM: Patient returned call about her lab results can be reached at Ph# [phone]

## 2021-09-19 NOTE — Progress Notes (Deleted)
     Patient: Cindy [name]  DOB: 12-01-1975 MRN: [id number] PCP: Patient, No Pcp Per (Inactive)  Referring Provider: ***  No chief complaint on file.    Patient Active Problem List   Diagnosis Date Noted   Pap smear abnormality of cervix with LGSIL 06/05/2016     Subjective:  Cindy [name] is a 45 y.o. F presents for Hepatitis B treatment. Found to have HepB on testing by PCP on 09/11/21. HBsAG+, HBeAg-/Ab+, HB sAB-, HB cab+(IgM-).   ROS  Past Medical History:  Diagnosis Date   Vaginal Pap smear, abnormal 2017   LGSIL w/ + HPV    Outpatient Medications Prior to Visit  Medication Sig Dispense Refill   acetaminophen (TYLENOL) 500 MG tablet Take 1 tablet (500 mg total) by mouth every 6 (six) hours as needed. 30 tablet 0   cyclobenzaprine (FLEXERIL) 10 MG tablet 1/2-1 tab 3 times daily as needed for muscle spasms 60 tablet 0   HYDROcodone-acetaminophen (NORCO/VICODIN) 5-325 MG tablet Take 2 tablets by mouth every 4 (four) hours as needed. (Patient not taking: No sig reported) 10 tablet 0   ibuprofen (ADVIL) 600 MG tablet Take 1 tablet (600 mg total) by mouth every 6 (six) hours as needed. 60 tablet 0   methylPREDNISolone (MEDROL DOSEPAK) 4 MG TBPK tablet Follow package insert (Patient not taking: Reported on 09/11/2021) 21 each 0   Prenatal Multivit-Min-Fe-FA (PRENATAL VITAMINS) 0.8 MG tablet Take 1 tablet by mouth daily. (Patient not taking: No sig reported) 90 tablet 3   ranitidine (ZANTAC) 150 MG tablet Take 1 tablet (150 mg total) by mouth 2 (two) times daily. (Patient not taking: No sig reported) 60 tablet 0   No facility-administered medications prior to visit.     No Known Allergies  Social History   Tobacco Use   Smoking status: Never   Smokeless tobacco: Never  Vaping Use   Vaping Use: Never used  Substance Use Topics   Alcohol use: No    Alcohol/week: 0.0 standard drinks   Drug use: No    No family history on file.  Objective:  There were no vitals filed  for this visit. There is no height or weight on file to calculate BMI.  Physical Exam  Lab Results: Lab Results  Component Value Date   WBC 3.7 (L) 08/19/2020   HGB 14.6 08/19/2020   HCT 44.6 08/19/2020   MCV 84.6 08/19/2020   PLT 180 08/19/2020    Lab Results  Component Value Date   CREATININE 0.73 09/11/2021   BUN 11 09/11/2021   NA 140 09/11/2021   K 4.2 09/11/2021   CL 103 09/11/2021   CO2 25 09/11/2021    Lab Results  Component Value Date   ALT 9 09/11/2021   AST 13 09/11/2021   ALKPHOS 53 09/11/2021   BILITOT 0.3 09/11/2021     Assessment & Plan:  Hepatitis B infection -Need HBF viral load to see if pt has a chronic inactive state vs acitive. IF <2000  and no cirrhosis will plan on monitoring. -HAV and abdominal u/s  -Follow-up in 1 month  Danelle Earthly, MD [facility]   09/19/21  6:49 AM

## 2021-09-22 NOTE — Telephone Encounter (Signed)
Spoke with pt on 11/2 regarding labs

## 2021-09-29 ENCOUNTER — Ambulatory Visit: Payer: Medicaid Other | Admitting: Physician Assistant

## 2021-10-08 ENCOUNTER — Other Ambulatory Visit: Payer: Self-pay

## 2021-10-08 ENCOUNTER — Ambulatory Visit (INDEPENDENT_AMBULATORY_CARE_PROVIDER_SITE_OTHER): Payer: Medicaid Other | Admitting: Internal Medicine

## 2021-10-08 VITALS — BP 129/86 | HR 79 | Resp 16 | Ht 69.5 in | Wt 230.0 lb

## 2021-10-08 DIAGNOSIS — B181 Chronic viral hepatitis B without delta-agent: Secondary | ICD-10-CM | POA: Diagnosis not present

## 2021-10-08 NOTE — Progress Notes (Signed)
[facility]  Reason for Consult:chronic hepatitis b Referring Provider: Freeman Caldron    Patient Active Problem List   Diagnosis Date Noted   Pap smear abnormality of cervix with LGSIL 06/05/2016      HPI: Cindy [name] is a 45 y.o. female referred here for chronic hep b diagnosis/management  Patient is here with language interpreter [place]AR[place], [place] from [place]  Reviewed epic chart She was seen on 10/27 with her pcp, and wanted hiv/hepatitis screening. Hep B panel with positive hep B sAg. Hep c ab was non-reactive  Patient is a g4p4. She was told by her doctor during one of her previous pregnancy that she might have hepatitis B. Her youngest child is 5 years  Timing of diagnosis: unclear  Risk: ?perinatal vs sexual; no hx ivdu, indu, medical field job, kidney/dialysis issue, blood transfusion, tatoos, known exposure to closed co-habitant/relatives with hep c  Cirrhosis finding: No hx n/v/hematemesis, bloody/black stool, abd distension, fluid withdrawal from abd/lungs, hx confusion, fatigue, edema, weight loss, poor apetite  Extra gi manifestation: No abnormal skin rash, fatigue, diffuse joint pain/swelling, hx kidney disease    We reviewed: Natural history of hep b and its complications available treatment options for hepatitis B Other factors potentially worsening liver disease, including alcohol use; obesity; diabetes mellitus, and viral coinfection We discussed potential medications that can contribute to liver inflammation like acetaminophen, and to avoiding excessive amount (more than 3 gram daily use) acetaminophen  Patient doesn't drink etoh Patient doesn't have diabetes  Patient is not aware of any family history of liver disease  Fam hx: No liver disease  Review of Systems: ROS All other ros negative      Past Medical History:  Diagnosis Date   Vaginal Pap smear, abnormal 2017   LGSIL w/ + HPV    Social  History   Tobacco Use   Smoking status: Never   Smokeless tobacco: Never  Vaping Use   Vaping Use: Never used  Substance Use Topics   Alcohol use: No    Alcohol/week: 0.0 standard drinks   Drug use: No      No Known Allergies  OBJECTIVE: Vitals:   10/08/21 1031  BP: 129/86  Pulse: 79  Resp: 16  SpO2: 100%  Weight: 230 lb (104.3 kg)  Height: 5' 9.5" (1.765 m)   Body mass index is 33.48 kg/m.   Physical Exam General/constitutional: no distress, pleasant, obese HEENT: Normocephalic, PER, Conj Clear, EOMI, Oropharynx clear Neck supple CV: rrr no mrg Lungs: clear to auscultation, normal respiratory effort Abd: Soft, Nontender Ext: no edema Skin: No Rash Neuro: nonfocal MSK: no peripheral joint swelling/tenderness/warmth; back spines nontender   Lab: Lab Results  Component Value Date   WBC 3.7 (L) 08/19/2020   HGB 14.6 08/19/2020   HCT 44.6 08/19/2020   MCV 84.6 08/19/2020   PLT 180 26/33/3545   Last metabolic panel Lab Results  Component Value Date   GLUCOSE 93 09/11/2021   NA 140 09/11/2021   K 4.2 09/11/2021   CL 103 09/11/2021   CO2 25 09/11/2021   BUN 11 09/11/2021   CREATININE 0.73 09/11/2021   EGFR 105 09/11/2021   CALCIUM 9.5 09/11/2021   PROT 7.3 09/11/2021   ALBUMIN 4.1 09/11/2021   LABGLOB 3.2 09/11/2021   AGRATIO 1.3 09/11/2021   BILITOT 0.3 09/11/2021   ALKPHOS 53 09/11/2021   AST 13 09/11/2021   ALT 9 09/11/2021   ANIONGAP 10 08/19/2020  No results found for: INR, PROTIME  Microbiology:  Serology:  Imaging:   Assessment/plan: Problem List Items Addressed This Visit   None Visit Diagnoses     Chronic viral hepatitis B without delta agent and without coma ([place])    -  Primary   Relevant Orders   US ABDOMEN COMPLETE W/ELASTOGRAPHY   Hepatitis B DNA, ultraquantitative, PCR   Hepatitis delta antibody   Protime-INR       #chronic hep b Hiv nonreactive Chronic based on history Hep B eAg negative; eAb  positive Normal lft However age >23; unclear family hx liver disease  [name] need more information/monitoring prior to deciding if need to treat Discussed natural hx hep b, risk for cancer of liver at any time during infectious stage  Patient has no prior hep b treatment  -hep d screen -hep b dna -elastography -inr -f/u around 4 weeks    -discussed natural progression of hep B, transmission (avoid sharing personal hygiene equipment) -discussed avoid toxin like etoh and excessive acetamaminphen (no more than 2 gram a day) -discussed healthy life style and good glucose control -discussed proper infection control       Follow-up: No follow-ups on file.  Jabier Mutton, [place] for [place] [phone] pager   [phone] cell 10/08/2021, 10:49 AM

## 2021-10-08 NOTE — Patient Instructions (Signed)
Will need some more testing today along with ultrasound of your liver   Will need to follow you at least every 6 months to determine if you need treatment at all   Please see [place] in 4-6 weeks to discuss result of your ultrasound/blood test and next step

## 2021-10-18 LAB — PROTIME-INR
INR: 0.9
Prothrombin Time: 9.7 s (ref 9.0–11.5)

## 2021-10-18 LAB — HEPATITIS B DNA, ULTRAQUANTITATIVE, PCR
Hepatitis B DNA (Calc): 2.08 Log IU/mL — ABNORMAL HIGH
Hepatitis B DNA: 120 IU/mL — ABNORMAL HIGH

## 2021-10-18 LAB — HEPATITIS DELTA ANTIBODY: Hepatitis D Ab, Total: NEGATIVE

## 2021-10-20 ENCOUNTER — Ambulatory Visit (HOSPITAL_COMMUNITY)
Admission: RE | Admit: 2021-10-20 | Discharge: 2021-10-20 | Disposition: A | Discharge status: Home / Self Care | Payer: Medicaid Other | Source: Ambulatory Visit | Attending: Internal Medicine | Admitting: Internal Medicine

## 2021-10-20 DIAGNOSIS — B181 Chronic viral hepatitis B without delta-agent: Secondary | ICD-10-CM | POA: Insufficient documentation

## 2021-11-19 ENCOUNTER — Other Ambulatory Visit: Payer: Self-pay

## 2021-11-19 ENCOUNTER — Ambulatory Visit (INDEPENDENT_AMBULATORY_CARE_PROVIDER_SITE_OTHER): Payer: Medicaid Other | Admitting: Internal Medicine

## 2021-11-19 VITALS — BP 126/81 | HR 98 | Resp 16 | Ht 69.5 in | Wt 234.6 lb

## 2021-11-19 DIAGNOSIS — B181 Chronic viral hepatitis B without delta-agent: Secondary | ICD-10-CM | POA: Diagnosis not present

## 2021-11-19 NOTE — Progress Notes (Signed)
[facility] for [facility]  Reason for Consult:chronic hepatitis b Referring Provider: Freeman Caldron    Patient Active Problem List   Diagnosis Date Noted   Pap smear abnormality of cervix with LGSIL 06/05/2016      HPI: Cindy [name] is a 46 y.o. female referred here for chronic hep b diagnosis/management  Patient is here with language interpreter [place], [place] from [place]   11/19/21 id clinic f/u Reviewed labs and elastography. Hep b DNA 120 Doing well No n/v/diarrhea    I first saw her 10/08/21: ------------- Reviewed epic chart She was seen on 10/27 with her pcp, and wanted hiv/hepatitis screening. Hep B panel with positive hep B sAg. Hep c ab was non-reactive  Patient is a g4p4. She was told by her doctor during one of her previous pregnancy that she might have hepatitis B. Her youngest child is 5 years  Timing of diagnosis: unclear  Risk: ?perinatal vs sexual; no hx ivdu, indu, medical field job, kidney/dialysis issue, blood transfusion, tatoos, known exposure to closed co-habitant/relatives with hep c  Cirrhosis finding: No hx n/v/hematemesis, bloody/black stool, abd distension, fluid withdrawal from abd/lungs, hx confusion, fatigue, edema, weight loss, poor apetite  Extra gi manifestation: No abnormal skin rash, fatigue, diffuse joint pain/swelling, hx kidney disease    We reviewed: Natural history of hep b and its complications available treatment options for hepatitis B Other factors potentially worsening liver disease, including alcohol use; obesity; diabetes mellitus, and viral coinfection We discussed potential medications that can contribute to liver inflammation like acetaminophen, and to avoiding excessive amount (more than 3 gram daily use) acetaminophen  Patient doesn't drink etoh Patient doesn't have diabetes  Patient is not aware of any family history of liver disease  Fam hx: No liver disease  Review of  Systems: ROS All other ros negative      Past Medical History:  Diagnosis Date   Vaginal Pap smear, abnormal 2017   LGSIL w/ + HPV    Social History   Tobacco Use   Smoking status: Never   Smokeless tobacco: Never  Vaping Use   Vaping Use: Never used  Substance Use Topics   Alcohol use: No    Alcohol/week: 0.0 standard drinks   Drug use: No      No Known Allergies  OBJECTIVE: Vitals:   11/19/21 1045  BP: 126/81  Pulse: 98  Resp: 16  SpO2: 100%  Weight: 234 lb 9.6 oz (106.4 kg)  Height: 5' 9.5" (1.765 m)   Body mass index is 34.15 kg/m.   Physical Exam General/constitutional: no distress, pleasant HEENT: Normocephalic, PER, Conj Clear, EOMI, Oropharynx clear Neck supple CV: rrr no mrg Lungs: clear to auscultation, normal respiratory effort Abd: Soft, Nontender Ext: no edema Skin: No Rash Neuro: nonfocal MSK: no peripheral joint swelling/tenderness/warmth; back spines nontender    Lab: Lab Results  Component Value Date   WBC 3.7 (L) 08/19/2020   HGB 14.6 08/19/2020   HCT 44.6 08/19/2020   MCV 84.6 08/19/2020   PLT 180 93/79/0240   Last metabolic panel Lab Results  Component Value Date   GLUCOSE 93 09/11/2021   NA 140 09/11/2021   K 4.2 09/11/2021   CL 103 09/11/2021   CO2 25 09/11/2021   BUN 11 09/11/2021   CREATININE 0.73 09/11/2021   EGFR 105 09/11/2021   CALCIUM 9.5 09/11/2021   PROT 7.3 09/11/2021   ALBUMIN 4.1 09/11/2021   LABGLOB 3.2 09/11/2021   AGRATIO  1.3 09/11/2021   BILITOT 0.3 09/11/2021   ALKPHOS 53 09/11/2021   AST 13 09/11/2021   ALT 9 09/11/2021   ANIONGAP 10 08/19/2020   Lab Results  Component Value Date   INR 0.9 10/08/2021    Microbiology:  Serology: 11/23 hep b dna 200   Imaging: 10/20/21 liver elastography No focal liver lesion Median kPa:  5.1   Diagnostic category:  < or = 5 kPa: high probability of being normal  Assessment/plan: Problem List Items Addressed This Visit   None Visit  Diagnoses     Chronic viral hepatitis B without delta agent and without coma ([place])    -  Primary      #chronic hep b Hiv nonreactive Chronic based on history Hep B eAg negative; eAb positive Normal lft Patient has no prior hep b treatment  Hep B dna 200 on 11/23; 10/27 lft normal 12/5 elastography no advance fibrosis or focal lesion  -At this time no need to start hepatitis B suppression. Follow up in 6 months   Reviewed these again today 11/19/21: -discussed natural progression of hep B, transmission (avoid sharing personal hygiene equipment) -discussed avoid toxin like etoh and excessive acetamaminphen (no more than 2 gram a day) -discussed healthy life style and good glucose control -discussed proper infection control       Follow-up: No follow-ups on file.  Jabier Mutton, [place] for [place] [phone] pager   [phone] cell 11/19/2021, 10:56 AM

## 2021-11-19 NOTE — Patient Instructions (Signed)
Follow up with [place] in 6 months to repeat blood testing. You'll be seeing [place] twice a year to monitor your hepatitis b status and see if we need to treat

## 2022-03-09 ENCOUNTER — Ambulatory Visit: Payer: Medicaid Other | Admitting: Physician Assistant

## 2022-05-21 ENCOUNTER — Ambulatory Visit: Payer: Medicaid Other | Admitting: Internal Medicine

## 2023-09-20 ENCOUNTER — Ambulatory Visit (HOSPITAL_COMMUNITY)
Admission: EM | Admit: 2023-09-20 | Discharge: 2023-09-20 | Disposition: A | Discharge status: Home / Self Care | Payer: Medicaid Other | Attending: Internal Medicine | Admitting: Internal Medicine

## 2023-09-20 DIAGNOSIS — Z0189 Encounter for other specified special examinations: Secondary | ICD-10-CM | POA: Insufficient documentation

## 2023-09-20 DIAGNOSIS — B181 Chronic viral hepatitis B without delta-agent: Secondary | ICD-10-CM | POA: Insufficient documentation

## 2023-09-20 LAB — COMPREHENSIVE METABOLIC PANEL
ALT: 11 U/L (ref 0–44)
AST: 25 U/L (ref 15–41)
Albumin: 4.1 g/dL (ref 3.5–5.0)
Alkaline Phosphatase: 45 U/L (ref 38–126)
Anion gap: 10 (ref 5–15)
BUN: 10 mg/dL (ref 6–20)
CO2: 29 mmol/L (ref 22–32)
Calcium: 9.9 mg/dL (ref 8.9–10.3)
Chloride: 100 mmol/L (ref 98–111)
Creatinine, Ser: 0.64 mg/dL (ref 0.44–1.00)
GFR, Estimated: >60 mL/min (ref >60)
Glucose, Bld: 99 mg/dL (ref 70–99)
Potassium: 4.8 mmol/L (ref 3.5–5.1)
Sodium: 139 mmol/L (ref 135–145)
Total Bilirubin: 1.1 mg/dL (ref <1.2)
Total Protein: 8 g/dL (ref 6.5–8.1)

## 2023-09-20 LAB — HEPATITIS B SURFACE ANTIGEN: Hepatitis B Surface Ag: REACTIVE — AB

## 2023-09-20 LAB — HEPATITIS B CORE ANTIBODY, TOTAL: Hep B Core Total Ab: REACTIVE — AB

## 2023-09-20 NOTE — ED Provider Notes (Signed)
[facility]    CSN: [id number] Arrival date & time: 09/20/23  8295      History   Chief Complaint Chief Complaint  Patient presents with   blood work    HPI Cindy [name] is a 47 y.o. female with a history of chronic hepatitis B infection recently completed treatment comes to urgent care requesting routine blood work.  Patient's primary care provider wants the patient to be retested for hepatitis B.  Patient has no complaints.  History was obtained from the patient's brother via the phone.   HPI  Past Medical History:  Diagnosis Date   Vaginal Pap smear, abnormal 2017   LGSIL w/ + HPV    Patient Active Problem List   Diagnosis Date Noted   Pap smear abnormality of cervix with LGSIL 06/05/2016    Past Surgical History:  Procedure Laterality Date   NO PAST SURGERIES      OB History     Gravida  4   Para  4   Term  4   Preterm      AB      Living  4      SAB      IAB      Ectopic      Multiple  0   Live Births  1            Home Medications    Prior to Admission medications   Medication Sig Start Date End Date Taking? Authorizing Provider  cyclobenzaprine (FLEXERIL) 10 MG tablet 1/2-1 tab 3 times daily as needed for muscle spasms Patient not taking: Reported on 10/08/2021 09/11/21   Anders Simmonds, PA-C  HYDROcodone-acetaminophen (NORCO/VICODIN) 5-325 MG tablet Take 2 tablets by mouth every 4 (four) hours as needed. Patient not taking: Reported on 05/08/2018 03/11/17   Rolland Porter, MD  ibuprofen (ADVIL) 600 MG tablet Take 1 tablet (600 mg total) by mouth every 6 (six) hours as needed. 09/11/21   Anders Simmonds, PA-C  methylPREDNISolone (MEDROL DOSEPAK) 4 MG TBPK tablet Follow package insert Patient not taking: Reported on 09/11/2021 09/01/21   Virgina Norfolk, DO  Prenatal Multivit-Min-Fe-FA (PRENATAL VITAMINS) 0.8 MG tablet Take 1 tablet by mouth daily. Patient not taking: Reported on 11/24/2016 02/07/16   Morrell Riddle, PA-C   ranitidine (ZANTAC) 150 MG tablet Take 1 tablet (150 mg total) by mouth 2 (two) times daily. Patient not taking: Reported on 08/21/2020 05/08/18   Jeanie Sewer, PA-C    Family History No family history on file.  Social History Social History   Tobacco Use   Smoking status: Never   Smokeless tobacco: Never  Vaping Use   Vaping status: Never Used  Substance Use Topics   Alcohol use: No    Alcohol/week: 0.0 standard drinks of alcohol   Drug use: No     Allergies   Patient has no known allergies.   Review of Systems Review of Systems As per HPI  Physical Exam Triage Vital Signs ED Triage Vitals [09/20/23 0913]  Encounter Vitals Group     BP 127/85     Systolic BP Percentile      Diastolic BP Percentile      Pulse Rate 66     Resp 18     Temp 98 F (36.7 C)     Temp src      SpO2 98 %     Weight      Height      Head  Circumference      Peak Flow      Pain Score      Pain Loc      Pain Education      Exclude from Growth Chart    No data found.  Updated Vital Signs BP 127/85   Pulse 66   Temp 98 F (36.7 C)   Resp 18   SpO2 98%   Visual Acuity Right Eye Distance:   Left Eye Distance:   Bilateral Distance:    Right Eye Near:   Left Eye Near:    Bilateral Near:     Physical Exam Vitals and nursing note reviewed.  Constitutional:      General: She is not in acute distress.    Appearance: She is not ill-appearing.  Cardiovascular:     Rate and Rhythm: Normal rate and regular rhythm.     Pulses: Normal pulses.     Heart sounds: Normal heart sounds.  Pulmonary:     Effort: Pulmonary effort is normal.     Breath sounds: Normal breath sounds.  Neurological:     Mental Status: She is alert.      UC Treatments / Results  Labs (all labs ordered are listed, but only abnormal results are displayed) Labs Reviewed  HEPATITIS B SURFACE ANTIGEN  COMPREHENSIVE METABOLIC PANEL  HEPATITIS B CORE ANTIBODY, TOTAL    EKG   Radiology No results  found.  Procedures Procedures (including critical care time)  Medications Ordered in UC Medications - No data to display  Initial Impression / Assessment and Plan / UC Course  I have reviewed the triage vital signs and the nursing notes.  Pertinent labs & imaging results that were available during my care of the patient were reviewed by me and considered in my medical decision making (see chart for details).     1.  Routine blood draw for chronic hepatitis B infection: Hepatitis B surface antigen and antibodies for hepatitis B COVID antigen has been sent Patient will follow-up with primary care physician for further management. Return precautions given. Final Clinical Impressions(s) / UC Diagnoses   Final diagnoses:  Routine lab draw  Hepatitis B carrier [facility])     Discharge Instructions      Will call you with recommendations if labs are abnormal Please follow-up with your primary care physician for further direction.    ED Prescriptions   None    PDMP not reviewed this encounter.   Merrilee Jansky, MD 09/20/23 [phone]

## 2023-09-20 NOTE — ED Triage Notes (Signed)
Pt sent by PCP for blood work. This Clinical research associate contacted Brother by phone but Brother does not know what type of blood work. This writer has been instructed to call Pt Sister for more information @ [phone].

## 2023-09-20 NOTE — ED Triage Notes (Signed)
To reach Pt's sister line is busy.

## 2023-09-20 NOTE — ED Triage Notes (Signed)
PLEASE Call Brother for translation [phone]. Pt's cell does not work .

## 2023-09-20 NOTE — ED Notes (Signed)
2nd call to Sisiter no answer.

## 2023-09-20 NOTE — Discharge Instructions (Signed)
Will call you with recommendations if labs are abnormal Please follow-up with your primary care physician for further direction.

## 2023-10-28 ENCOUNTER — Encounter: Payer: Self-pay | Admitting: Internal Medicine

## 2023-10-28 ENCOUNTER — Ambulatory Visit: Payer: Self-pay | Admitting: Internal Medicine

## 2023-10-28 ENCOUNTER — Other Ambulatory Visit: Payer: Self-pay

## 2023-10-28 VITALS — BP 128/84 | HR 75 | Temp 97.5°F | Wt 238.0 lb

## 2023-10-28 DIAGNOSIS — B181 Chronic viral hepatitis B without delta-agent: Secondary | ICD-10-CM

## 2023-10-28 NOTE — Patient Instructions (Signed)
Labs and ultrasound to be setup today   See me in 6 months for routine follow up   If anything is abnormal on lab or ultrasound I'll call you.   If you don't hear from [place] that's good news and we'll just revisit in 6 months

## 2023-10-28 NOTE — Progress Notes (Signed)
[facility]  Reason for Consult:chronic hepatitis b Referring Provider: Georgian Co    Patient Active Problem List   Diagnosis Date Noted   Pap smear abnormality of cervix with LGSIL 06/05/2016      HPI: Cindy [name] is a 47 y.o. female referred here for chronic hep b diagnosis/management  Patient is here with language interpreter [employer], dialect from [place]  10/28/23 id clinic f/u See a&p   11/19/21 id clinic f/u Reviewed labs and elastography. Hep b DNA 120 Doing well No n/v/diarrhea    I first saw her 10/08/21: ------------- Reviewed epic chart She was seen on 10/27 with her pcp, and wanted hiv/hepatitis screening. Hep B panel with positive hep B sAg. Hep c ab was non-reactive  Patient is a g4p4. She was told by her doctor during one of her previous pregnancy that she might have hepatitis B. Her youngest child is 5 years  Timing of diagnosis: unclear  Risk: ?perinatal vs sexual; no hx ivdu, indu, medical field job, kidney/dialysis issue, blood transfusion, tatoos, known exposure to closed co-habitant/relatives with hep c  Cirrhosis finding: No hx n/v/hematemesis, bloody/black stool, abd distension, fluid withdrawal from abd/lungs, hx confusion, fatigue, edema, weight loss, poor apetite  Extra gi manifestation: No abnormal skin rash, fatigue, diffuse joint pain/swelling, hx kidney disease    We reviewed: Natural history of hep b and its complications available treatment options for hepatitis B Other factors potentially worsening liver disease, including alcohol use; obesity; diabetes mellitus, and viral coinfection We discussed potential medications that can contribute to liver inflammation like acetaminophen, and to avoiding excessive amount (more than 3 gram daily use) acetaminophen  Patient doesn't drink etoh Patient doesn't have diabetes  Patient is not aware of any family history of liver disease  Fam hx: No  liver disease  Review of Systems: ROS All other ros negative      Past Medical History:  Diagnosis Date   Vaginal Pap smear, abnormal 2017   LGSIL w/ + HPV    Social History   Tobacco Use   Smoking status: Never   Smokeless tobacco: Never  Vaping Use   Vaping status: Never Used  Substance Use Topics   Alcohol use: No    Alcohol/week: 0.0 standard drinks of alcohol   Drug use: No      No Known Allergies  OBJECTIVE: Vitals:   10/28/23 1103  BP: 128/84  Pulse: 75  Temp: (!) 97.5 F (36.4 C)  TempSrc: Temporal  SpO2: 100%  Weight: 238 lb (108 kg)   Body mass index is 34.64 kg/m.   Physical Exam General/constitutional: no distress, pleasant HEENT: Normocephalic, PER, Conj Clear, EOMI, Oropharynx clear Neck supple CV: rrr no mrg Lungs: clear to auscultation, normal respiratory effort Abd: Soft, Nontender Ext: no edema Skin: No Rash Neuro: nonfocal MSK: no peripheral joint swelling/tenderness/warmth; back spines nontender    Lab: Lab Results  Component Value Date   WBC 3.7 (L) 08/19/2020   HGB 14.6 08/19/2020   HCT 44.6 08/19/2020   MCV 84.6 08/19/2020   PLT 180 08/19/2020   Last metabolic panel Lab Results  Component Value Date   GLUCOSE 99 09/20/2023   NA 139 09/20/2023   K 4.8 09/20/2023   CL 100 09/20/2023   CO2 29 09/20/2023   BUN 10 09/20/2023   CREATININE 0.64 09/20/2023   EGFR 105 09/11/2021   CALCIUM 9.9 09/20/2023   PROT 8.0 09/20/2023   ALBUMIN 4.1 09/20/2023  LABGLOB 3.2 09/11/2021   AGRATIO 1.3 09/11/2021   BILITOT 1.1 09/20/2023   ALKPHOS 45 09/20/2023   AST 25 09/20/2023   ALT 11 09/20/2023   ANIONGAP 10 09/20/2023   Lab Results  Component Value Date   INR 0.9 10/08/2021    Microbiology:  Serology: 11/23 hep b dna 200   Imaging: Reviewed   10/20/21 liver elastography No focal liver lesion Median kPa:  5.1   Diagnostic category:  < or = 5 kPa: high probability of being  normal  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)    -  Primary   Relevant Orders   Hepatitis B DNA, ultraquantitative, PCR   US Abdomen Limited RUQ (LIVER/GB)       #chronic hep b Hiv nonreactive Chronic based on history Hep B eAg negative; eAb positive Normal lft Patient has no prior hep b treatment  Hep B dna 200 on 11/23; 10/27 lft normal 12/5 elastography no advance fibrosis or focal lesion    10/28/23 id clinic assessment Reviewed previous labs/imaging Recent 09/2023 labs reviewed; dna not done but rest of labs with normal lft and cbc profile Clinically doing well Not on antiviral Due for hcc screening Recheck hep b level today  F/u 6 months Will call patient if anything is abnormal -- so no news is good news          Follow-up: Return in about 6 months (around 04/27/2024).  Raymondo Band, MD [facility] [phone] pager   [phone] cell 10/28/2023, 11:12 AM

## 2023-10-31 LAB — HEPATITIS B DNA, ULTRAQUANTITATIVE, PCR
Hepatitis B DNA: 438 [IU]/mL — ABNORMAL HIGH
Hepatitis B virus DNA: 2.64 {Log} — ABNORMAL HIGH

## 2023-11-11 ENCOUNTER — Ambulatory Visit (HOSPITAL_COMMUNITY)
Admission: RE | Admit: 2023-11-11 | Discharge: 2023-11-11 | Disposition: A | Discharge status: Home / Self Care | Payer: Self-pay | Source: Ambulatory Visit | Attending: Internal Medicine | Admitting: Internal Medicine

## 2023-11-11 DIAGNOSIS — B181 Chronic viral hepatitis B without delta-agent: Secondary | ICD-10-CM | POA: Insufficient documentation

## 2023-11-18 ENCOUNTER — Telehealth: Payer: Self-pay

## 2023-11-18 NOTE — Telephone Encounter (Signed)
-----   Message from Raymondo Band sent at 11/16/2023 11:59 AM EST ----- Hi team Could you please let her know the imaging shows no sign of advance liver disease F/u with me twice a year as previously discussed  Thank you

## 2023-11-18 NOTE — Telephone Encounter (Signed)
 Called [employer], no [employer] interpreter available at the moment. Will have to try again later.  Sandie Ano, RN

## 2023-11-19 NOTE — Telephone Encounter (Signed)
 Second attempt to connect with Zarma interpreter with [employer]. No interpreter available at the moment, but they confirm they do offer [employer] as a service.   Spoke with Harlene with [employer]. She states [employer] does not offer Zarma. She has placed a request to have [employer] interpreter assist today or Monday 1/6.   Adalind Weitz D Markeisha Mancias, RN

## 2023-11-19 NOTE — Telephone Encounter (Signed)
 Called patient x 2 with interpreter, Hilda Lias. No answer. Interpreter left message requesting call back.   Sandie Ano, RN

## 2023-11-19 NOTE — Telephone Encounter (Signed)
 Patient sister returned call. Aware of results. Front desk will reach out schedule follow up appointment.  Weston Fulco Lesli Albee, CMA

## 2024-05-02 ENCOUNTER — Ambulatory Visit (INDEPENDENT_AMBULATORY_CARE_PROVIDER_SITE_OTHER): Payer: Self-pay | Admitting: Internal Medicine

## 2024-05-02 ENCOUNTER — Encounter: Payer: Self-pay | Admitting: Internal Medicine

## 2024-05-02 ENCOUNTER — Other Ambulatory Visit: Payer: Self-pay

## 2024-05-02 VITALS — BP 115/75 | HR 79 | Resp 15 | Ht 69.5 in | Wt 238.0 lb

## 2024-05-02 DIAGNOSIS — B181 Chronic viral hepatitis B without delta-agent: Secondary | ICD-10-CM

## 2024-05-02 NOTE — Progress Notes (Signed)
 [facility]  Reason for Consult:chronic hepatitis b Referring Provider: Dulce Gibbs    Patient Active Problem List   Diagnosis Date Noted   Pap smear abnormality of cervix with LGSIL 06/05/2016      HPI: Cindy [name] is a 48 y.o. female referred here for chronic hep b diagnosis/management  Patient is here with language interpreter ZARMA, dialect from [place]  05/02/24 id clinic f/u See a&p   11/19/21 id clinic f/u Reviewed labs and elastography. Hep b DNA 120 Doing well No n/v/diarrhea    I first saw her 10/08/21: ------------- Reviewed epic chart She was seen on 10/27 with her pcp, and wanted hiv/hepatitis screening. Hep B panel with positive hep B sAg. Hep c ab was non-reactive  Patient is a g4p4. She was told by her doctor during one of her previous pregnancy that she might have hepatitis B. Her youngest child is 5 years  Timing of diagnosis: unclear  Risk: ?perinatal vs sexual; no hx ivdu, indu, medical field job, kidney/dialysis issue, blood transfusion, tatoos, known exposure to closed co-habitant/relatives with hep c  Cirrhosis finding: No hx n/v/hematemesis, bloody/black stool, abd distension, fluid withdrawal from abd/lungs, hx confusion, fatigue, edema, weight loss, poor apetite  Extra gi manifestation: No abnormal skin rash, fatigue, diffuse joint pain/swelling, hx kidney disease    We reviewed: Natural history of hep b and its complications available treatment options for hepatitis B Other factors potentially worsening liver disease, including alcohol use; obesity; diabetes mellitus, and viral coinfection We discussed potential medications that can contribute to liver inflammation like acetaminophen , and to avoiding excessive amount (more than 3 gram daily use) acetaminophen   Patient doesn't drink etoh Patient doesn't have diabetes  Patient is not aware of any family history of liver disease  Fam hx: No  liver disease  Review of Systems: ROS All other ros negative      Past Medical History:  Diagnosis Date   Vaginal Pap smear, abnormal 2017   LGSIL w/ + HPV    Social History   Tobacco Use   Smoking status: Never   Smokeless tobacco: Never  Vaping Use   Vaping status: Never Used  Substance Use Topics   Alcohol use: No    Alcohol/week: 0.0 standard drinks of alcohol   Drug use: No      No Known Allergies  OBJECTIVE: Vitals:   05/02/24 1121  BP: 115/75  Pulse: 79  Resp: 15  SpO2: 99%  Weight: 238 lb (108 kg)  Height: 5' 9.5 (1.765 m)   Body mass index is 34.64 kg/m.   Physical Exam General/constitutional: no distress, pleasant HEENT: Normocephalic, PER, Conj Clear, EOMI, Oropharynx clear Neck supple CV: rrr no mrg Lungs: clear to auscultation, normal respiratory effort Abd: Soft, Nontender Ext: no edema Skin: No Rash Neuro: nonfocal MSK: no peripheral joint swelling/tenderness/warmth; back spines nontender    Lab: Lab Results  Component Value Date   WBC 3.7 (L) 08/19/2020   HGB 14.6 08/19/2020   HCT 44.6 08/19/2020   MCV 84.6 08/19/2020   PLT 180 08/19/2020   Last metabolic panel Lab Results  Component Value Date   GLUCOSE 99 09/20/2023   NA 139 09/20/2023   K 4.8 09/20/2023   CL 100 09/20/2023   CO2 29 09/20/2023   BUN 10 09/20/2023   CREATININE 0.64 09/20/2023   EGFR 105 09/11/2021   CALCIUM 9.9 09/20/2023   PROT 8.0 09/20/2023   ALBUMIN 4.1 09/20/2023  LABGLOB 3.2 09/11/2021   AGRATIO 1.3 09/11/2021   BILITOT 1.1 09/20/2023   ALKPHOS 45 09/20/2023   AST 25 09/20/2023   ALT 11 09/20/2023   ANIONGAP 10 09/20/2023   Lab Results  Component Value Date   INR 0.9 10/08/2021    Microbiology:  Serology: 11/23 hep b dna 200   Imaging: Reviewed   10/20/21 liver elastography No focal liver lesion Median kPa:  5.1   Diagnostic category:  < or = 5 kPa: high probability of being normal     10/2023 liver u/s 1.  Increased hepatic parenchymal echogenicity suggestive of steatosis. 2. No cholelithiasis or sonographic evidence for acute cholecystitis.  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)    -  Primary   Relevant Orders   CBC   COMPLETE METABOLIC PANEL WITHOUT GFR   AFP tumor marker   Hepatitis B surface antibody,quantitative   Hepatitis B Surface AntiGEN   Hepatitis B DNA, ultraquantitative, PCR   US  Abdomen Limited RUQ (LIVER/GB)        #chronic hep b; hep eAg negative Hiv nonreactive Patient has no prior hep b treatment when first evaluated at [facility]  Hep B dna 200 on 11/23; 10/27 lft normal 12/5 elastography no advance fibrosis or focal lesion    10/28/23 id clinic assessment Reviewed previous labs/imaging Recent 09/2023 labs reviewed; dna not done but rest of labs with normal lft and cbc profile Clinically doing well Not on antiviral Due for hcc screening Recheck hep b level today  F/u 6 months Will call patient if anything is abnormal -- so no news is good news    05/02/24 id clini assessment Doing well not on hep b treatment 10/2023 hep b dna 430; alt 11  Repeat labs today Repeat liver u/s biannual hcc screening  Refer patient to nih/who guideline on frequency of hep b/hcc monitoring (strictly 6 months vs every 12 months) and patient prefer every 6 months  F/u in 6 months and will repeat labs/hcc screening at that time  Advise getting pcp to at least monitor/screen for diabetes/metabolic syndrome and also age appropriate cancer screening      Follow-up: Return in about 6 months (around 11/01/2024).  Jamesetta Mcbride, MD [facility] [phone] pager   [phone] cell 05/02/2024, 11:29 AM

## 2024-05-02 NOTE — Patient Instructions (Addendum)
 We'll continue seeing you every 6 months with labs and repeat liver ultrasound   Please get labs today Liver u/s is ordered and our clinic will help schedule   Please call these numbers to arrange primary care visit **

## 2024-05-05 LAB — COMPLETE METABOLIC PANEL WITHOUT GFR
AG Ratio: 1.3 (calc) (ref 1.0–2.5)
ALT: 10 U/L (ref 6–29)
AST: 12 U/L (ref 10–35)
Albumin: 4 g/dL (ref 3.6–5.1)
Alkaline phosphatase (APISO): 54 U/L (ref 31–125)
BUN/Creatinine Ratio: 14 (calc) (ref 6–22)
BUN: 15 mg/dL (ref 7–25)
CO2: 27 mmol/L (ref 20–32)
Calcium: 9.2 mg/dL (ref 8.6–10.2)
Chloride: 102 mmol/L (ref 98–110)
Creat: 1.07 mg/dL — ABNORMAL HIGH (ref 0.50–0.99)
Globulin: 3 g/dL (ref 1.9–3.7)
Glucose, Bld: 94 mg/dL (ref 65–99)
Potassium: 3.9 mmol/L (ref 3.5–5.3)
Sodium: 138 mmol/L (ref 135–146)
Total Bilirubin: 0.3 mg/dL (ref 0.2–1.2)
Total Protein: 7 g/dL (ref 6.1–8.1)

## 2024-05-05 LAB — CBC
HCT: 43.3 % (ref 35.0–45.0)
Hemoglobin: 13.9 g/dL (ref 11.7–15.5)
MCH: 27.5 pg (ref 27.0–33.0)
MCHC: 32.1 g/dL (ref 32.0–36.0)
MCV: 85.6 fL (ref 80.0–100.0)
MPV: 9.9 fL (ref 7.5–12.5)
Platelets: 240 10*3/uL (ref 140–400)
RBC: 5.06 10*6/uL (ref 3.80–5.10)
RDW: 12.1 % (ref 11.0–15.0)
WBC: 4.2 10*3/uL (ref 3.8–10.8)

## 2024-05-05 LAB — HEPATITIS B SURFACE ANTIBODY, QUANTITATIVE: Hep B S AB Quant (Post): <5 m[IU]/mL — ABNORMAL LOW (ref >=10)

## 2024-05-05 LAB — AFP TUMOR MARKER: AFP-Tumor Marker: 4.6 ng/mL

## 2024-05-05 LAB — HEPATITIS B DNA, ULTRAQUANTITATIVE, PCR
Hepatitis B DNA: 379 [IU]/mL — ABNORMAL HIGH
Hepatitis B virus DNA: 2.58 {Log_IU}/mL — ABNORMAL HIGH

## 2024-05-05 LAB — HEPATITIS B SURFACE ANTIGEN: Hepatitis B Surface Ag: REACTIVE — AB

## 2024-05-16 ENCOUNTER — Ambulatory Visit (HOSPITAL_COMMUNITY)
Admission: RE | Admit: 2024-05-16 | Discharge: 2024-05-16 | Disposition: A | Discharge status: Home / Self Care | Payer: Self-pay | Source: Ambulatory Visit | Attending: Internal Medicine | Admitting: Internal Medicine

## 2024-05-16 DIAGNOSIS — B181 Chronic viral hepatitis B without delta-agent: Secondary | ICD-10-CM | POA: Insufficient documentation

## 2024-10-31 ENCOUNTER — Other Ambulatory Visit: Payer: Self-pay

## 2024-10-31 ENCOUNTER — Ambulatory Visit: Payer: Self-pay | Admitting: Internal Medicine

## 2024-10-31 VITALS — BP 143/89 | HR 89 | Temp 98.1°F | Resp 16 | Wt 244.0 lb

## 2024-10-31 DIAGNOSIS — B181 Chronic viral hepatitis B without delta-agent: Secondary | ICD-10-CM

## 2024-10-31 NOTE — Patient Instructions (Signed)
 You are doing good from hepatitis b standpoint   Will continue twice a year lab and ultrasound -- please do blood test today and schedule your ultrasound    Please see one of my providers   Thank you

## 2024-10-31 NOTE — Progress Notes (Signed)
 [facility]  Reason for Consult:chronic hepatitis b Referring Provider: Danton Slough    Patient Active Problem List   Diagnosis Date Noted   Pap smear abnormality of cervix with LGSIL 06/05/2016      HPI: Cindy [name] is a 48 y.o. female referred here for chronic hep b diagnosis/management  Patient is here with language interpreter ZARMA, dialect from [place]  10/31/24 id clinic f/u Routine biannual f/u for chronic hep b See a&p   11/19/21 id clinic f/u Reviewed labs and elastography. Hep b DNA 120 Doing well No n/v/diarrhea    I first saw her 10/08/21: ------------- Reviewed epic chart She was seen on 10/27 with her pcp, and wanted hiv/hepatitis screening. Hep B panel with positive hep B sAg. Hep c ab was non-reactive  Patient is a g4p4. She was told by her doctor during one of her previous pregnancy that she might have hepatitis B. Her youngest child is 5 years  Timing of diagnosis: unclear  Risk: ?perinatal vs sexual; no hx ivdu, indu, medical field job, kidney/dialysis issue, blood transfusion, tatoos, known exposure to closed co-habitant/relatives with hep c  Cirrhosis finding: No hx n/v/hematemesis, bloody/black stool, abd distension, fluid withdrawal from abd/lungs, hx confusion, fatigue, edema, weight loss, poor apetite  Extra gi manifestation: No abnormal skin rash, fatigue, diffuse joint pain/swelling, hx kidney disease    We reviewed: Natural history of hep b and its complications available treatment options for hepatitis B Other factors potentially worsening liver disease, including alcohol use; obesity; diabetes mellitus, and viral coinfection We discussed potential medications that can contribute to liver inflammation like acetaminophen , and to avoiding excessive amount (more than 3 gram daily use) acetaminophen   Patient doesn't drink etoh Patient doesn't have diabetes  Patient is not aware of any family  history of liver disease  Fam hx: No liver disease  Review of Systems: ROS All other ros negative      Past Medical History:  Diagnosis Date   Vaginal Pap smear, abnormal 2017   LGSIL w/ + HPV    Social History   Tobacco Use   Smoking status: Never   Smokeless tobacco: Never  Vaping Use   Vaping status: Never Used  Substance Use Topics   Alcohol use: No    Alcohol/week: 0.0 standard drinks of alcohol   Drug use: No      No Known Allergies  OBJECTIVE: Vitals:   10/31/24 1044  BP: (!) 143/89  Pulse: 89  Resp: 16  Temp: 98.1 F (36.7 C)  TempSrc: Oral  SpO2: 99%  Weight: 244 lb (110.7 kg)   Body mass index is 35.52 kg/m.   Physical Exam General/constitutional: no distress, pleasant HEENT: Normocephalic, PER, Conj Clear, EOMI, Oropharynx clear Neck supple CV: rrr no mrg Lungs: clear to auscultation, normal respiratory effort Abd: Soft, Nontender Ext: no edema Skin: No Rash Neuro: nonfocal MSK: no peripheral joint swelling/tenderness/warmth; back spines nontender    Lab: Lab Results  Component Value Date   WBC 4.2 05/02/2024   HGB 13.9 05/02/2024   HCT 43.3 05/02/2024   MCV 85.6 05/02/2024   PLT 240 05/02/2024   Last metabolic panel Lab Results  Component Value Date   GLUCOSE 94 05/02/2024   NA 138 05/02/2024   K 3.9 05/02/2024   CL 102 05/02/2024   CO2 27 05/02/2024   BUN 15 05/02/2024   CREATININE 1.07 (H) 05/02/2024   EGFR 105 09/11/2021   CALCIUM 9.2 05/02/2024  PROT 7.0 05/02/2024   ALBUMIN 4.1 09/20/2023   LABGLOB 3.2 09/11/2021   AGRATIO 1.3 09/11/2021   BILITOT 0.3 05/02/2024   ALKPHOS 45 09/20/2023   AST 12 05/02/2024   ALT 10 05/02/2024   ANIONGAP 10 09/20/2023   Lab Results  Component Value Date   INR 0.9 10/08/2021    Microbiology:  Serology:   Imaging: Reviewed   10/20/21 liver elastography No focal liver lesion Median kPa:  5.1   Diagnostic category:  < or = 5 kPa: high probability of being  normal     10/2023 liver u/s 1. Increased hepatic parenchymal echogenicity suggestive of steatosis. 2. No cholelithiasis or sonographic evidence for acute cholecystitis.  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)    -  Primary   Relevant Orders   CBC   COMPLETE METABOLIC PANEL WITHOUT GFR   Hepatitis B surface antibody,quantitative   Hepatitis B Surface AntiGEN   Hepatitis B e antibody   Hepatitis B e antigen   Hepatitis B DNA, ultraquantitative, PCR   US  Abdomen Limited RUQ (LIVER/GB)         #chronic hep b; hep eAg negative Hiv nonreactive Patient has no prior hep b treatment when first evaluated at [facility]  Hep B dna 200 on 11/23; 10/27 lft normal 12/5 elastography no advance fibrosis or focal lesion    10/28/23 id clinic assessment Reviewed previous labs/imaging Recent 09/2023 labs reviewed; dna not done but rest of labs with normal lft and cbc profile Clinically doing well Not on antiviral Due for hcc screening Recheck hep b level today  F/u 6 months Will call patient if anything is abnormal -- so no news is good news    05/02/24 id clini assessment Doing well not on hep b treatment 10/2023 hep b dna 430; alt 11  Repeat labs today Repeat liver u/s biannual hcc screening  Refer patient to nih/who guideline on frequency of hep b/hcc monitoring (strictly 6 months vs every 12 months) and patient prefer every 6 months  F/u in 6 months and will repeat labs/hcc screening at that time  Advise getting pcp to at least monitor/screen for diabetes/metabolic syndrome and also age appropriate cancer screening  10/31/24 id clinic assessment Patient not currently on treatment for hep b No complaint today 04/2024 labs alt 10; platelet 240; afp 4.6 04/2024 hep b dna 379; sAg reactive; sAb <5 05/2024 liver u/s -- no hepatoma   She reports that she is  taking traditional medication and was wondering if that'll get rid of hep b. She doesn't know what it's called. She get it from [place]. She has been taking it 3 months as of 10/31/24. No nausea/abdominal pain/decreased appetite/malaise  -repeat hep b labs -repeat liver u/s biannual screening -f/u 6 months    Follow-up: Return in about 6 months (around 05/01/2025).   Cindy ONEIDA Passer, MD [facility] [phone] pager   [phone] cell 10/31/2024, 10:57 AM

## 2024-11-03 LAB — CBC
HCT: 42.6 % (ref 35.9–46.0)
Hemoglobin: 13.9 g/dL (ref 11.7–15.5)
MCH: 27.1 pg (ref 27.0–33.0)
MCHC: 32.6 g/dL (ref 31.6–35.4)
MCV: 83.2 fL (ref 81.4–101.7)
MPV: 10.5 fL (ref 7.5–12.5)
Platelets: 237 Thousand/uL (ref 140–400)
RBC: 5.12 Million/uL — ABNORMAL HIGH (ref 3.80–5.10)
RDW: 11.9 % (ref 11.0–15.0)
WBC: 4.9 Thousand/uL (ref 3.8–10.8)

## 2024-11-03 LAB — COMPLETE METABOLIC PANEL WITHOUT GFR
AG Ratio: 1.1 (calc) (ref 1.0–2.5)
ALT: 10 U/L (ref 6–29)
AST: 14 U/L (ref 10–35)
Albumin: 4 g/dL (ref 3.6–5.1)
Alkaline phosphatase (APISO): 58 U/L (ref 31–125)
BUN: 15 mg/dL (ref 7–25)
CO2: 32 mmol/L (ref 20–32)
Calcium: 9.5 mg/dL (ref 8.6–10.2)
Chloride: 102 mmol/L (ref 98–110)
Creat: 0.68 mg/dL (ref 0.50–0.99)
Globulin: 3.7 g/dL (ref 1.9–3.7)
Glucose, Bld: 81 mg/dL (ref 65–99)
Potassium: 3.7 mmol/L (ref 3.5–5.3)
Sodium: 139 mmol/L (ref 135–146)
Total Bilirubin: 0.4 mg/dL (ref 0.2–1.2)
Total Protein: 7.7 g/dL (ref 6.1–8.1)

## 2024-11-03 LAB — HEPATITIS B DNA, ULTRAQUANTITATIVE, PCR
Hepatitis B DNA: 401 [IU]/mL — ABNORMAL HIGH
Hepatitis B virus DNA: 2.6 {Log_IU}/mL — ABNORMAL HIGH

## 2024-11-03 LAB — HEPATITIS B E ANTIBODY: Hep B E Ab: REACTIVE — AB

## 2024-11-03 LAB — HEPATITIS B E ANTIGEN: Hep B E Ag: NONREACTIVE

## 2024-11-03 LAB — HEPATITIS B SURFACE ANTIBODY, QUANTITATIVE: Hep B S AB Quant (Post): <5 m[IU]/mL — ABNORMAL LOW (ref >=10)

## 2024-11-03 LAB — HEPATITIS B SURFACE ANTIGEN: Hepatitis B Surface Ag: REACTIVE — AB

## 2024-11-06 ENCOUNTER — Ambulatory Visit (HOSPITAL_COMMUNITY)
Admission: RE | Admit: 2024-11-06 | Discharge: 2024-11-06 | Disposition: A | Discharge status: Home / Self Care | Payer: Self-pay | Source: Ambulatory Visit | Attending: Internal Medicine | Admitting: Internal Medicine

## 2024-11-06 DIAGNOSIS — B181 Chronic viral hepatitis B without delta-agent: Secondary | ICD-10-CM | POA: Insufficient documentation

## 2024-11-16 ENCOUNTER — Ambulatory Visit: Payer: Self-pay | Admitting: Internal Medicine

## 2024-11-17 NOTE — Progress Notes (Signed)
 Zarma interpreter not available. Left voicemail with results. Lorenda CHRISTELLA Code, RMA

## 2024-12-08 ENCOUNTER — Telehealth: Payer: Self-pay

## 2024-12-08 NOTE — Telephone Encounter (Signed)
 Zarma is not available via [employer]. Called [facility] interpreter coordinator, no answer and unable to leave message.   Ayelet Gruenewald, BSN, RN

## 2024-12-08 NOTE — Telephone Encounter (Signed)
-----   Message from Constance Passer, MD sent at 12/05/2024 11:52 AM EST ----- Hi team could you please call her (she requested for this time) to let her know the liver ultrasound and hepatitis b labs look fine  Will see her in 1 year from last visit  She does need a language interpreter   Thank you ----- Message ----- From: Passer Constance DASEN, MD Sent: 10/31/2024  11:11 AM EST To: Constance DASEN Passer, MD  Please call about ultrasound and hepatititis b labs when available

## 2024-12-15 NOTE — Telephone Encounter (Signed)
 Spoke with Harlene with [employer]. [employer]arma interpreter, Earnie, should be available around 10:45 AM today.   Marie: [phone]

## 2024-12-15 NOTE — Telephone Encounter (Signed)
 Called patient with Cindy [name] on the line, no answer.   Mekia Dipinto, BSN, RN

## 2025-05-01 ENCOUNTER — Ambulatory Visit: Payer: Self-pay | Admitting: Infectious Disease
